# Patient Record
Sex: Male | Born: 1975 | Race: Black or African American | Hispanic: No | Marital: Single | Smoking: Never smoker
Health system: Southern US, Community
[De-identification: ages and names within clinical notes are randomized; demographics above are authoritative.]

## PROBLEM LIST (undated history)

## (undated) HISTORY — PX: CHOLECYSTECTOMY: SHX55

---

## 2015-07-23 ENCOUNTER — Emergency Department (HOSPITAL_COMMUNITY)
Admission: EM | Admit: 2015-07-23 | Discharge: 2015-07-23 | Disposition: A | Discharge status: Home / Self Care | Payer: Commercial Managed Care - HMO | Attending: Emergency Medicine | Admitting: Emergency Medicine

## 2015-07-23 ENCOUNTER — Emergency Department (HOSPITAL_COMMUNITY): Payer: Commercial Managed Care - HMO

## 2015-07-23 ENCOUNTER — Encounter (HOSPITAL_COMMUNITY): Payer: Self-pay | Admitting: Nurse Practitioner

## 2015-07-23 DIAGNOSIS — S0591XA Unspecified injury of right eye and orbit, initial encounter: Secondary | ICD-10-CM | POA: Diagnosis present

## 2015-07-23 DIAGNOSIS — S0231XA Fracture of orbital floor, right side, initial encounter for closed fracture: Secondary | ICD-10-CM | POA: Diagnosis not present

## 2015-07-23 DIAGNOSIS — Y9289 Other specified places as the place of occurrence of the external cause: Secondary | ICD-10-CM | POA: Diagnosis not present

## 2015-07-23 DIAGNOSIS — S0285XA Fracture of orbit, unspecified, initial encounter for closed fracture: Secondary | ICD-10-CM

## 2015-07-23 DIAGNOSIS — W228XXA Striking against or struck by other objects, initial encounter: Secondary | ICD-10-CM | POA: Insufficient documentation

## 2015-07-23 DIAGNOSIS — Y998 Other external cause status: Secondary | ICD-10-CM | POA: Diagnosis not present

## 2015-07-23 DIAGNOSIS — S0280XA Fracture of other specified skull and facial bones, unspecified side, initial encounter for closed fracture: Secondary | ICD-10-CM

## 2015-07-23 DIAGNOSIS — Y9389 Activity, other specified: Secondary | ICD-10-CM | POA: Diagnosis not present

## 2015-07-23 LAB — I-STAT CHEM 8, ED
BUN: 7 mg/dL (ref 6–20)
Calcium, Ion: 1.17 mmol/L (ref 1.12–1.23)
Chloride: 103 mmol/L (ref 101–111)
Creatinine, Ser: 1.1 mg/dL (ref 0.61–1.24)
Glucose, Bld: 82 mg/dL (ref 65–99)
HCT: 44 % (ref 39.0–52.0)
Hemoglobin: 15 g/dL (ref 13.0–17.0)
Potassium: 3.9 mmol/L (ref 3.5–5.1)
Sodium: 143 mmol/L (ref 135–145)
TCO2: 27 mmol/L (ref 0–100)

## 2015-07-23 MED ORDER — IOPAMIDOL (ISOVUE-300) INJECTION 61%
INTRAVENOUS | Status: AC
  Administered 2015-07-23: 75 mL
  Filled 2015-07-23: qty 75

## 2015-07-23 MED ORDER — TETRACAINE HCL 0.5 % OP SOLN
1.0000 [drp] | Freq: Once | OPHTHALMIC | Status: AC
  Administered 2015-07-23: 1 [drp] via OPHTHALMIC
  Filled 2015-07-23: qty 2

## 2015-07-23 MED ORDER — IBUPROFEN 600 MG PO TABS: 600.0000 mg | ORAL_TABLET | Freq: Four times a day (QID) | ORAL | Status: DC | PRN

## 2015-07-23 MED ORDER — FLUORESCEIN SODIUM 1 MG OP STRP
1.0000 | ORAL_STRIP | Freq: Once | OPHTHALMIC | Status: AC
  Administered 2015-07-23: 1 via OPHTHALMIC
  Filled 2015-07-23: qty 1
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
## 2015-07-23 NOTE — Discharge Instructions (Signed)
Orbital Floor Fracture, Non-Blowout The eye sits in the part of the skull called the "orbit." The upper and outside walls of the orbit are thick and strong. The inside wall (near the nose) and the orbital floor are very thin and weak. The tissues around the eye will briefly press together if there is a direct blow to the front of the eye. This leads to high pressure against the orbital walls. The inside wall and the orbital floor may break since these are the weakest walls. If the orbital floor breaks, the tissues around the eye, including the muscle that makes the eye look down, may become trapped in the sinus below when the orbital floor "blows out." If a blowout does not happen, the orbital floor fracture is considered a non-blowout orbital fracture. CAUSES An orbital floor fracture can be caused by any accident in which an object hits the face or the face strikes against a hard object. The most common ways that people break their eye socket include:  Being hit by a blunt object, such as a baseball bat or a fist.  Striking the face on the car dashboard during a crash.  Falls.  Gunshot. SYMPTOMS  If there has been no injury to the eye itself, symptoms may include:  Puffiness (swelling) and bruising around the eye area (black eye).  Numbness of the cheek and upper gum on the side with the floor fracture. This is caused by nerve injury to these areas.  Pain around the eye.  Headache.  Ear pain on the injured side. DIAGNOSIS The diagnosis of an orbital floor fracture is suspected during an eye exam by an ophthalmologist. It is confirmed by X-rays or CT scan. TREATMENT Your caregiver may suggest waiting 1 or 2 weeks for the swelling to go away before examining the eye. When the swelling lessens, your caregiver will examine the eye to see if there is any sign of a trapped muscle or double vision when looking in different directions. If double vision is not found and muscle or tissue did not  get trapped, no further treatment is necessary. After that, in almost all cases, the bones heal together on their own.  HOME CARE INSTRUCTIONS  Take all pain medicine as directed by your caregiver.  Use ice packs or other cold therapy to reduce swelling as directed by your caregiver.  Do not put a contact lens in the injured eye until your caregiver approves.  Avoid dusty environments.  Always wear protective glasses or goggles when recommended. Wearing protective eyewear is not dangerous to your injured eye and will not delay healing.  As long as your other eye is seeing normally, you may return to work and drive.  You may travel by plane or be in high altitudes. However, your swelling may take longer to go away, and you may have sinus pain.  Be aware that your depth perception and your ability to judge distance may be reduced or lost. SEEK IMMEDIATE MEDICAL CARE IF:  Your vision changes.  Your redness or swelling persists around the injured eye or gets worse.  You start to have double vision.  You have a bloody or discolored discharge from your nose.  You have a fever that lasts longer than 2 to 3 days.  You have a fever that suddenly gets worse.  Your cheek or upper gum numbness does not go away. MAKE SURE YOU:  Understand these instructions.  Will watch your condition.  Will get help right away if  you are not doing well or get worse.   This information is not intended to replace advice given to you by your health care provider. Make sure you discuss any questions you have with your health care provider.   Document Released: 04/30/2011 Document Revised: 02/26/2014 Document Reviewed: 04/30/2011 Elsevier Interactive Patient Education [employer]2016 [employer] Inc.

## 2015-07-23 NOTE — ED Notes (Signed)
EDP at bedside  
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
## 2015-12-08 ENCOUNTER — Encounter (HOSPITAL_BASED_OUTPATIENT_CLINIC_OR_DEPARTMENT_OTHER): Payer: Self-pay | Admitting: *Deleted

## 2015-12-08 ENCOUNTER — Emergency Department (HOSPITAL_BASED_OUTPATIENT_CLINIC_OR_DEPARTMENT_OTHER)
Admission: EM | Admit: 2015-12-08 | Discharge: 2015-12-08 | Disposition: A | Discharge status: Home / Self Care | Payer: Commercial Managed Care - HMO | Attending: Emergency Medicine | Admitting: Emergency Medicine

## 2015-12-08 DIAGNOSIS — Z792 Long term (current) use of antibiotics: Secondary | ICD-10-CM | POA: Insufficient documentation

## 2015-12-08 DIAGNOSIS — L03113 Cellulitis of right upper limb: Secondary | ICD-10-CM | POA: Diagnosis not present

## 2015-12-08 DIAGNOSIS — Y939 Activity, unspecified: Secondary | ICD-10-CM | POA: Diagnosis not present

## 2015-12-08 DIAGNOSIS — Y999 Unspecified external cause status: Secondary | ICD-10-CM | POA: Insufficient documentation

## 2015-12-08 DIAGNOSIS — W57XXXA Bitten or stung by nonvenomous insect and other nonvenomous arthropods, initial encounter: Secondary | ICD-10-CM | POA: Diagnosis not present

## 2015-12-08 DIAGNOSIS — S60861A Insect bite (nonvenomous) of right wrist, initial encounter: Secondary | ICD-10-CM | POA: Diagnosis present

## 2015-12-08 DIAGNOSIS — Y929 Unspecified place or not applicable: Secondary | ICD-10-CM | POA: Insufficient documentation

## 2015-12-08 LAB — COMPREHENSIVE METABOLIC PANEL
ALBUMIN: 3.3 g/dL — AB (ref 3.5–5.0)
ALK PHOS: 80 U/L (ref 38–126)
ALT: 16 U/L — AB (ref 17–63)
ANION GAP: 6 (ref 5–15)
AST: 21 U/L (ref 15–41)
BILIRUBIN TOTAL: 0.3 mg/dL (ref 0.3–1.2)
BUN: 10 mg/dL (ref 6–20)
CALCIUM: 8.8 mg/dL — AB (ref 8.9–10.3)
CO2: 27 mmol/L (ref 22–32)
CREATININE: 1.11 mg/dL (ref 0.61–1.24)
Chloride: 105 mmol/L (ref 101–111)
GFR calc Af Amer: >60 mL/min (ref >60)
GFR calc non Af Amer: >60 mL/min (ref >60)
GLUCOSE: 122 mg/dL — AB (ref 65–99)
Potassium: 3.8 mmol/L (ref 3.5–5.1)
SODIUM: 138 mmol/L (ref 135–145)
TOTAL PROTEIN: 7.5 g/dL (ref 6.5–8.1)

## 2015-12-08 LAB — CBC WITH DIFFERENTIAL/PLATELET
BASOS PCT: 0 %
Basophils Absolute: 0 10*3/uL (ref 0.0–0.1)
EOS ABS: 0.2 10*3/uL (ref 0.0–0.7)
Eosinophils Relative: 2 %
HEMATOCRIT: 41.3 % (ref 39.0–52.0)
HEMOGLOBIN: 14 g/dL (ref 13.0–17.0)
LYMPHS PCT: 18 %
Lymphs Abs: 2 10*3/uL (ref 0.7–4.0)
MCH: 28.6 pg (ref 26.0–34.0)
MCHC: 33.9 g/dL (ref 30.0–36.0)
MCV: 84.5 fL (ref 78.0–100.0)
Monocytes Absolute: 0.9 10*3/uL (ref 0.1–1.0)
Monocytes Relative: 8 %
NEUTROS PCT: 72 %
Neutro Abs: 8.2 10*3/uL — ABNORMAL HIGH (ref 1.7–7.7)
Platelets: 253 10*3/uL (ref 150–400)
RBC: 4.89 MIL/uL (ref 4.22–5.81)
RDW: 13.9 % (ref 11.5–15.5)
WBC: 11.3 10*3/uL — AB (ref 4.0–10.5)

## 2015-12-08 MED ORDER — SULFAMETHOXAZOLE-TRIMETHOPRIM 800-160 MG PO TABS: 1.0000 | ORAL_TABLET | Freq: Two times a day (BID) | ORAL | 0 refills | Status: AC

## 2015-12-08 MED ORDER — VANCOMYCIN HCL 10 G IV SOLR
15.0000 mg/kg | Freq: Once | INTRAVENOUS | Status: AC
  Administered 2015-12-08: 1844 mg via INTRAVENOUS
  Filled 2015-12-08: qty 1844

## 2015-12-08 MED ORDER — CEPHALEXIN 500 MG PO CAPS: 500.0000 mg | ORAL_CAPSULE | Freq: Four times a day (QID) | ORAL | 0 refills | Status: DC

## 2015-12-08 MED ORDER — VANCOMYCIN HCL 500 MG IV SOLR
INTRAVENOUS | Status: AC
  Filled 2015-12-08: qty 2000

## 2015-12-08 NOTE — ED Triage Notes (Signed)
Possible insect bite to his right wrist. [facility] and treated with antibiotics. Here today for a recheck.

## 2015-12-08 NOTE — Discharge Instructions (Signed)
STOP taking the clindamycin and start the new antibiotics. If the rash is spreading Friday or Saturday please return to the emergency department.  The rash may not be gone completely, but if the rash is not improved by Sunday please return to the emergency department

## 2015-12-08 NOTE — ED Provider Notes (Signed)
[facility] Provider Note   CSN: [id number] Arrival date & time: 12/08/15  1431     History   Chief Complaint Chief Complaint  Patient presents with  . Insect Bite    HPI Victor Crawford is a 40 y.o. male.   Rash   This is a new problem. The current episode started more than 2 days ago. The problem has not changed since onset.The problem is associated with nothing. There has been no fever. The rash is present on the right wrist. The pain is at a severity of 5/10. The pain is mild. The pain has been constant since onset. Associated symptoms include blisters, itching, pain and weeping. Treatments tried: clindamycin. The treatment provided no relief.    History reviewed. No pertinent past medical history.  There are no active problems to display for this patient.   Past Surgical History:  Procedure Laterality Date  . CHOLECYSTECTOMY         Home Medications    Prior to Admission medications   Medication Sig Start Date End Date Taking? Authorizing Provider  clindamycin (CLEOCIN) 300 MG capsule Take 300 mg by mouth 3 (three) times daily.   Yes Historical Provider, MD  cephALEXin (KEFLEX) 500 MG capsule Take 1 capsule (500 mg total) by mouth 4 (four) times daily. 12/08/15   Marily Memos, MD  ibuprofen (ADVIL,MOTRIN) 600 MG tablet Take 1 tablet (600 mg total) by mouth every 6 (six) hours as needed. 07/23/15   Lyndal Pulley, MD  sulfamethoxazole-trimethoprim (BACTRIM DS,SEPTRA DS) 800-160 MG tablet Take 1 tablet by mouth 2 (two) times daily. 12/08/15 12/15/15  Marily Memos, MD    Family History No family history on file.  Social History Social History  Substance Use Topics  . Smoking status: Never Smoker  . Smokeless tobacco: Never Used  . Alcohol use No     Allergies   Review of patient's allergies indicates no known allergies.   Review of Systems Review of Systems  Skin: Positive for itching and rash.  All other systems reviewed and are  negative.    Physical Exam Updated Vital Signs BP 126/88 (BP Location: Left Arm)   Pulse 75   Temp 99 F (37.2 C) (Oral)   Resp 18   Ht 5\' 5"  (1.651 m)   Wt 271 lb (122.9 kg)   SpO2 96%   BMI 45.10 kg/m   Physical Exam  Constitutional: He is oriented to person, place, and time. He appears well-developed and well-nourished.  HENT:  Head: Normocephalic and atraumatic.  Eyes: Conjunctivae are normal.  Neck: Normal range of motion. Neck supple.  Cardiovascular: Normal rate and regular rhythm.   No murmur heard. Pulmonary/Chest: Effort normal and breath sounds normal. No respiratory distress.  Abdominal: Soft. There is no tenderness. There is no guarding.  Musculoskeletal: He exhibits no edema or deformity.  No pain with ROM of right wrist  Neurological: He is alert and oriented to person, place, and time.  Skin: Skin is warm and dry. Rash (to lateral right wrist, not circumferential. mild serosanguos drainage. ) noted.  Psychiatric: He has a normal mood and affect.  Nursing note and vitals reviewed.    ED Treatments / Results  Labs (all labs ordered are listed, but only abnormal results are displayed) Labs Reviewed  CBC WITH DIFFERENTIAL/PLATELET - Abnormal; Notable for the following:       Result Value   WBC 11.3 (*)    Neutro Abs 8.2 (*)    All other components  within normal limits  COMPREHENSIVE METABOLIC PANEL - Abnormal; Notable for the following:    Glucose, Bld 122 (*)    Calcium 8.8 (*)    Albumin 3.3 (*)    ALT 16 (*)    All other components within normal limits    EKG  EKG Interpretation None       Radiology No results found.  Procedures Procedures (including critical care time)  Medications Ordered in ED Medications  vancomycin (VANCOCIN) 500 MG powder (  Not Given 12/08/15 1616)  vancomycin (VANCOCIN) 1,844 mg in sodium chloride 0.9 % 500 mL IVPB (0 mg/kg  122.9 kg Intravenous Stopped 12/08/15 1802)     Initial Impression / Assessment  and Plan / ED Course  I have reviewed the triage vital signs and the nursing notes.  Pertinent labs & imaging results that were available during my care of the patient were reviewed by me and considered in my medical decision making (see chart for details).  Clinical Course   No pain with ROM of joint. Erythema localized over lateral aspect, doubt septic arthritis at this time. Been on clindamycin for a couple days, not improving, not worsening. Plan for IV vancomycin, switch to keflex/bactrim.  reeval with mild improvement. No e/o sepsis, joint or systemic involvement.   Final Clinical Impressions(s) / ED Diagnoses   Final diagnoses:  Cellulitis of right upper extremity    New Prescriptions New Prescriptions   CEPHALEXIN (KEFLEX) 500 MG CAPSULE    Take 1 capsule (500 mg total) by mouth 4 (four) times daily.   SULFAMETHOXAZOLE-TRIMETHOPRIM (BACTRIM DS,SEPTRA DS) 800-160 MG TABLET    Take 1 tablet by mouth 2 (two) times daily.     Marily MemosJason Ulyses Panico, MD 12/08/15 1900
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
## 2018-06-23 DIAGNOSIS — Z6841 Body Mass Index (BMI) 40.0 and over, adult: Secondary | ICD-10-CM | POA: Diagnosis not present

## 2018-06-23 DIAGNOSIS — E291 Testicular hypofunction: Secondary | ICD-10-CM | POA: Diagnosis not present

## 2018-06-23 DIAGNOSIS — M25569 Pain in unspecified knee: Secondary | ICD-10-CM | POA: Diagnosis not present

## 2019-05-14 ENCOUNTER — Inpatient Hospital Stay (HOSPITAL_COMMUNITY)
Admission: AD | Admit: 2019-05-14 | Discharge: 2019-05-19 | DRG: 177 | Disposition: A | Discharge status: Home / Self Care | Payer: 59 | Source: Other Acute Inpatient Hospital | Attending: Internal Medicine | Admitting: Internal Medicine

## 2019-05-14 ENCOUNTER — Other Ambulatory Visit: Payer: Self-pay

## 2019-05-14 ENCOUNTER — Encounter (HOSPITAL_COMMUNITY): Payer: Self-pay | Admitting: Internal Medicine

## 2019-05-14 DIAGNOSIS — G4733 Obstructive sleep apnea (adult) (pediatric): Secondary | ICD-10-CM | POA: Diagnosis present

## 2019-05-14 DIAGNOSIS — J1282 Pneumonia due to coronavirus disease 2019: Secondary | ICD-10-CM | POA: Diagnosis not present

## 2019-05-14 DIAGNOSIS — R748 Abnormal levels of other serum enzymes: Secondary | ICD-10-CM | POA: Diagnosis not present

## 2019-05-14 DIAGNOSIS — Z6841 Body Mass Index (BMI) 40.0 and over, adult: Secondary | ICD-10-CM

## 2019-05-14 DIAGNOSIS — E1165 Type 2 diabetes mellitus with hyperglycemia: Secondary | ICD-10-CM | POA: Diagnosis present

## 2019-05-14 DIAGNOSIS — T380X5A Adverse effect of glucocorticoids and synthetic analogues, initial encounter: Secondary | ICD-10-CM | POA: Diagnosis present

## 2019-05-14 DIAGNOSIS — J9601 Acute respiratory failure with hypoxia: Secondary | ICD-10-CM | POA: Diagnosis not present

## 2019-05-14 DIAGNOSIS — U071 COVID-19: Principal | ICD-10-CM

## 2019-05-14 DIAGNOSIS — M7989 Other specified soft tissue disorders: Secondary | ICD-10-CM | POA: Diagnosis not present

## 2019-05-14 DIAGNOSIS — Z79899 Other long term (current) drug therapy: Secondary | ICD-10-CM | POA: Diagnosis not present

## 2019-05-14 DIAGNOSIS — R739 Hyperglycemia, unspecified: Secondary | ICD-10-CM

## 2019-05-14 DIAGNOSIS — I10 Essential (primary) hypertension: Secondary | ICD-10-CM | POA: Diagnosis present

## 2019-05-14 DIAGNOSIS — R0602 Shortness of breath: Secondary | ICD-10-CM

## 2019-05-14 HISTORY — DX: Morbid (severe) obesity due to excess calories: E66.01

## 2019-05-14 LAB — COMPREHENSIVE METABOLIC PANEL
ALT: 186 U/L — ABNORMAL HIGH (ref 0–44)
AST: 169 U/L — ABNORMAL HIGH (ref 15–41)
Albumin: 2.5 g/dL — ABNORMAL LOW (ref 3.5–5.0)
Alkaline Phosphatase: 89 U/L (ref 38–126)
Anion gap: 10 (ref 5–15)
BUN: 13 mg/dL (ref 6–20)
CO2: 24 mmol/L (ref 22–32)
Calcium: 8.3 mg/dL — ABNORMAL LOW (ref 8.9–10.3)
Chloride: 104 mmol/L (ref 98–111)
Creatinine, Ser: 0.98 mg/dL (ref 0.61–1.24)
GFR calc Af Amer: >60 mL/min (ref >60)
GFR calc non Af Amer: >60 mL/min (ref >60)
Glucose, Bld: 321 mg/dL — ABNORMAL HIGH (ref 70–99)
Potassium: 4.3 mmol/L (ref 3.5–5.1)
Sodium: 138 mmol/L (ref 135–145)
Total Bilirubin: 0.6 mg/dL (ref 0.3–1.2)
Total Protein: 7.5 g/dL (ref 6.5–8.1)

## 2019-05-14 LAB — BLOOD GAS, ARTERIAL
Acid-base deficit: 0.1 mmol/L (ref 0.0–2.0)
Bicarbonate: 23.8 mmol/L (ref 20.0–28.0)
Drawn by: 406621
FIO2: 100
O2 Saturation: 92.7 %
Patient temperature: 36.7
pCO2 arterial: 36.8 mmHg (ref 32.0–48.0)
pH, Arterial: 7.424 (ref 7.350–7.450)
pO2, Arterial: 67.8 mmHg — ABNORMAL LOW (ref 83.0–108.0)

## 2019-05-14 LAB — ABO/RH: ABO/RH(D): B POS

## 2019-05-14 LAB — CBC WITH DIFFERENTIAL/PLATELET
Abs Immature Granulocytes: 0.1 10*3/uL — ABNORMAL HIGH (ref 0.00–0.07)
Basophils Absolute: 0 10*3/uL (ref 0.0–0.1)
Basophils Relative: 0 %
Blasts: 2 %
Eosinophils Absolute: 0 10*3/uL (ref 0.0–0.5)
Eosinophils Relative: 0 %
HCT: 44.4 % (ref 39.0–52.0)
Hemoglobin: 14.5 g/dL (ref 13.0–17.0)
Lymphocytes Relative: 10 %
Lymphs Abs: 0.6 10*3/uL — ABNORMAL LOW (ref 0.7–4.0)
MCH: 27.2 pg (ref 26.0–34.0)
MCHC: 32.7 g/dL (ref 30.0–36.0)
MCV: 83.1 fL (ref 80.0–100.0)
Monocytes Absolute: 0.1 10*3/uL (ref 0.1–1.0)
Monocytes Relative: 1 %
Myelocytes: 1 %
Neutro Abs: 4.7 10*3/uL (ref 1.7–7.7)
Neutrophils Relative %: 86 %
Platelets: 178 10*3/uL (ref 150–400)
RBC: 5.34 MIL/uL (ref 4.22–5.81)
RDW: 15.8 % — ABNORMAL HIGH (ref 11.5–15.5)
WBC: 5.5 10*3/uL (ref 4.0–10.5)
nRBC: 0 /100 WBC
nRBC: 0.4 % — ABNORMAL HIGH (ref 0.0–0.2)

## 2019-05-14 LAB — C-REACTIVE PROTEIN: CRP: 11.6 mg/dL — ABNORMAL HIGH (ref <1.0)

## 2019-05-14 LAB — GLUCOSE, CAPILLARY
Glucose-Capillary: 276 mg/dL — ABNORMAL HIGH (ref 70–99)
Glucose-Capillary: 282 mg/dL — ABNORMAL HIGH (ref 70–99)
Glucose-Capillary: 317 mg/dL — ABNORMAL HIGH (ref 70–99)

## 2019-05-14 LAB — HEMOGLOBIN A1C
Hgb A1c MFr Bld: 9.3 % — ABNORMAL HIGH (ref 4.8–5.6)
Mean Plasma Glucose: 220.21 mg/dL

## 2019-05-14 LAB — HIV ANTIBODY (ROUTINE TESTING W REFLEX): HIV Screen 4th Generation wRfx: NONREACTIVE

## 2019-05-14 LAB — MRSA PCR SCREENING: MRSA by PCR: POSITIVE — AB

## 2019-05-14 MED ORDER — ALBUTEROL SULFATE HFA 108 (90 BASE) MCG/ACT IN AERS
2.0000 | INHALATION_SPRAY | Freq: Four times a day (QID) | RESPIRATORY_TRACT | Status: DC
  Administered 2019-05-14 – 2019-05-16 (×8): 2 via RESPIRATORY_TRACT
  Filled 2019-05-14: qty 6.7

## 2019-05-14 MED ORDER — SODIUM CHLORIDE 0.9 % IV SOLN: 100.0000 mg | Freq: Every day | INTRAVENOUS | Status: DC

## 2019-05-14 MED ORDER — SODIUM CHLORIDE 0.9 % IV SOLN
200.0000 mg | Freq: Once | INTRAVENOUS | Status: DC
  Filled 2019-05-14: qty 40

## 2019-05-14 MED ORDER — ASCORBIC ACID 500 MG PO TABS
500.0000 mg | ORAL_TABLET | Freq: Every day | ORAL | Status: DC
  Administered 2019-05-14 – 2019-05-19 (×6): 500 mg via ORAL
  Filled 2019-05-14 (×6): qty 1

## 2019-05-14 MED ORDER — ACETAMINOPHEN 325 MG PO TABS
650.0000 mg | ORAL_TABLET | Freq: Four times a day (QID) | ORAL | Status: DC | PRN
  Administered 2019-05-14: 650 mg via ORAL
  Filled 2019-05-14: qty 2

## 2019-05-14 MED ORDER — DEXAMETHASONE SODIUM PHOSPHATE 10 MG/ML IJ SOLN
6.0000 mg | INTRAMUSCULAR | Status: DC
  Administered 2019-05-14 – 2019-05-18 (×5): 6 mg via INTRAVENOUS
  Filled 2019-05-14 (×5): qty 1

## 2019-05-14 MED ORDER — SODIUM CHLORIDE 0.9 % IV SOLN
2.0000 g | INTRAVENOUS | Status: DC
  Administered 2019-05-14: 2 g via INTRAVENOUS
  Filled 2019-05-14: qty 20

## 2019-05-14 MED ORDER — INSULIN ASPART 100 UNIT/ML ~~LOC~~ SOLN
0.0000 [IU] | Freq: Three times a day (TID) | SUBCUTANEOUS | Status: DC
  Administered 2019-05-14 – 2019-05-15 (×2): 8 [IU] via SUBCUTANEOUS

## 2019-05-14 MED ORDER — INSULIN DETEMIR 100 UNIT/ML ~~LOC~~ SOLN
14.0000 [IU] | Freq: Two times a day (BID) | SUBCUTANEOUS | Status: DC
  Administered 2019-05-14 – 2019-05-15 (×2): 14 [IU] via SUBCUTANEOUS
  Filled 2019-05-14 (×3): qty 0.14

## 2019-05-14 MED ORDER — INSULIN ASPART 100 UNIT/ML ~~LOC~~ SOLN
0.0000 [IU] | Freq: Every day | SUBCUTANEOUS | Status: DC
  Administered 2019-05-14: 4 [IU] via SUBCUTANEOUS
  Administered 2019-05-15 – 2019-05-16 (×2): 5 [IU] via SUBCUTANEOUS
  Administered 2019-05-17: 4 [IU] via SUBCUTANEOUS
  Administered 2019-05-18: 5 [IU] via SUBCUTANEOUS

## 2019-05-14 MED ORDER — TOCILIZUMAB 400 MG/20ML IV SOLN
800.0000 mg | Freq: Once | INTRAVENOUS | Status: AC
  Administered 2019-05-14: 800 mg via INTRAVENOUS
  Filled 2019-05-14: qty 40

## 2019-05-14 MED ORDER — AZITHROMYCIN 500 MG PO TABS
500.0000 mg | ORAL_TABLET | Freq: Every day | ORAL | Status: DC
  Administered 2019-05-14 – 2019-05-15 (×2): 500 mg via ORAL
  Filled 2019-05-14 (×2): qty 1

## 2019-05-14 MED ORDER — INSULIN ASPART 100 UNIT/ML ~~LOC~~ SOLN
0.0000 [IU] | Freq: Three times a day (TID) | SUBCUTANEOUS | Status: DC
  Administered 2019-05-14: 5 [IU] via SUBCUTANEOUS

## 2019-05-14 MED ORDER — ZINC SULFATE 220 (50 ZN) MG PO CAPS
220.0000 mg | ORAL_CAPSULE | Freq: Every day | ORAL | Status: DC
  Administered 2019-05-14 – 2019-05-19 (×6): 220 mg via ORAL
  Filled 2019-05-14 (×5): qty 1

## 2019-05-14 MED ORDER — FAMOTIDINE 20 MG PO TABS
20.0000 mg | ORAL_TABLET | Freq: Two times a day (BID) | ORAL | Status: DC
  Administered 2019-05-14 – 2019-05-19 (×10): 20 mg via ORAL
  Filled 2019-05-14 (×11): qty 1

## 2019-05-14 MED ORDER — ONDANSETRON HCL 4 MG/2ML IJ SOLN: 4.0000 mg | Freq: Four times a day (QID) | INTRAMUSCULAR | Status: DC | PRN

## 2019-05-14 MED ORDER — GUAIFENESIN-DM 100-10 MG/5ML PO SYRP: 10.0000 mL | ORAL_SOLUTION | ORAL | Status: DC | PRN

## 2019-05-14 MED ORDER — ONDANSETRON HCL 4 MG PO TABS: 4.0000 mg | ORAL_TABLET | Freq: Four times a day (QID) | ORAL | Status: DC | PRN

## 2019-05-14 MED ORDER — HYDROCOD POLST-CPM POLST ER 10-8 MG/5ML PO SUER
5.0000 mL | Freq: Two times a day (BID) | ORAL | Status: DC | PRN
  Administered 2019-05-14 – 2019-05-18 (×3): 5 mL via ORAL
  Filled 2019-05-14 (×3): qty 5

## 2019-05-14 MED ORDER — ENOXAPARIN SODIUM 80 MG/0.8ML ~~LOC~~ SOLN
0.5000 mg/kg | SUBCUTANEOUS | Status: DC
  Filled 2019-05-14: qty 0.8

## 2019-05-14 MED ORDER — LIVING WELL WITH DIABETES BOOK
Freq: Once | Status: AC
  Filled 2019-05-14: qty 1

## 2019-05-14 MED ORDER — SODIUM CHLORIDE 0.9 % IV SOLN
100.0000 mg | Freq: Every day | INTRAVENOUS | Status: AC
  Administered 2019-05-14 – 2019-05-17 (×4): 100 mg via INTRAVENOUS
  Filled 2019-05-14 (×4): qty 20

## 2019-05-14 MED ORDER — ENOXAPARIN SODIUM 80 MG/0.8ML ~~LOC~~ SOLN
0.5000 mg/kg | SUBCUTANEOUS | Status: DC
  Administered 2019-05-14: 70 mg via SUBCUTANEOUS
  Filled 2019-05-14 (×2): qty 0.8

## 2019-05-14 NOTE — Progress Notes (Addendum)
MEDICATION RELATED CONSULT NOTE - INITIAL   Pharmacy Consult for Remdesivir Indication: Covid -19 positive  No Known Allergies  Patient Measurements: Height: 5\' 5"  (165.1 cm) Weight: (!) 311 lb (141.1 kg) IBW/kg (Calculated) : 61.5  Vital Signs: Temp: 98.2 F (36.8 C) (03/25 1009) Temp Source: Axillary (03/25 1009) BP: 147/93 (03/25 1009) Pulse Rate: 85 (03/25 1009) Intake/Output from previous day: No intake/output data recorded. Intake/Output from this shift: No intake/output data recorded.  Labs: Recent Labs    05/14/19 1101  WBC 5.5  HGB 14.5  HCT 44.4  PLT 178  CREATININE 0.98  ALBUMIN 2.5*  PROT 7.5  AST 169*  ALT 186*  ALKPHOS 89  BILITOT 0.6   Estimated Creatinine Clearance: 124.3 mL/min (by C-G formula based on SCr of 0.98 mg/dL).   Microbiology: No results found for this or any previous visit (from the past 720 hour(s)).  Medical History: Past Medical History:  Diagnosis Date  . Morbid obesity (HCC)     Medications:  Scheduled:  . albuterol  2 puff Inhalation Q6H  . vitamin C  500 mg Oral Daily  . azithromycin  500 mg Oral Daily  . dexamethasone (DECADRON) injection  6 mg Intravenous Q24H  . enoxaparin (LOVENOX) injection  0.5 mg/kg Subcutaneous Q24H  . famotidine  20 mg Oral BID  . insulin aspart  0-9 Units Subcutaneous TID WC  . zinc sulfate  220 mg Oral Daily    Assessment: 44 y.[facility] male [facility] -- presented to [facility] on 3/24 with complaints of worsening shortness of breath.  He had been diagnosed at [facility] on 3/20 with COVID-19.    Patient reportedly was given 1 dose of steroids and remdesivir prior to transport.  I confirmed with [facility] pharmacist that patient received 1st dose of Remdesivir 200 mg o 05/12/21 @ 22:39 and dexamethasone 6 mg IV on 3/24 @ 22:38.   Also confirmed patient received lovenox 0.5 mg/kg = 70 mg at 01:00 on 05/14/19.   AST 329, ALT 237 @ [facility] 05/13/19> down to 169/186 today.      Plan:  Remdesivir 100 mg IV q24h x 4 days 3/25>05/17/19 Total days of Remdesivir 3/24 >>05/17/19.  05/19/19, RPh Clinical Pharmacist Please check AMION for all [facility] phone numbers After 10:00 PM, call Main Pharmacy [phone] 05/14/2019,12:19 PM

## 2019-05-14 NOTE — H&P (Signed)
History and Physical    Victor Crawford FMB:[id number] DOB: 1975/09/19 DOA: 05/14/2019  Referring MD/NP/PA: Odie Sera, MD PCP: Patient, No Pcp Per  Patient coming from: Transfer from [facility]    Chief Complaint: Shortness of breath  I have personally briefly reviewed patient's old medical records in [facility]   HPI: Victor Crawford is a 44 y.o. male with medical history significant of morbid obesity presented to [facility] health on 3/24 with complaints of worsening shortness of breath.  He had been diagnosed at [facility] on 3/20 with COVID-19.  Since his positive diagnosis he reported having a productive cough.  At home he had been given an inhaler to use, but reports no significant improvement in symptoms.  Associated symptoms included fever and general malaise.  Patient denies any history of tobacco or significant alcohol use.  Upon arrival to their emergency department patient was noted to be afebrile, but tachypneic with O2 saturations 68% on room air.  He was placed on a nonrebreather with improvement to greater than 90%.  Chest x-ray revealed mild hazy diffuse bilateral opacities.  Labs revealed CBC within normal limits, creatinine 0.9, glucose 239, AST 329, ALT 237, alkaline phosphatase 127 D-dimer 3757, procalcitonin 0.25, CRP 82.2, LDH 3230, and procalcitonin 0.25.  COVID-19 screen was positive.  Due to the patient's elevated D-dimer CT angiogram was performed.  Did not reveal signs of a PE, but did note extensive bilateral opacities consistent with Covid pneumonia.  Blood cultures have been obtained.  Patient reportedly was given 1 dose of steroids and remdesivir prior to transport. ED Course: As seen above  Review of Systems  Constitutional: Positive for fever and malaise/fatigue.  HENT: Negative for congestion and ear discharge.   Eyes: Negative for photophobia and pain.  Respiratory: Positive for cough, sputum production and shortness of breath.   Cardiovascular: Negative for chest  pain and leg swelling.  Gastrointestinal: Negative for abdominal pain, nausea and vomiting.  Genitourinary: Negative for dysuria and hematuria.  Musculoskeletal: Negative for falls and joint pain.  Skin: Negative for rash.  Neurological: Negative for focal weakness and weakness.  Endo/Heme/Allergies: Negative for polydipsia. Does not bruise/bleed easily.  Psychiatric/Behavioral: Negative for substance abuse.    Past Medical History:  Diagnosis Date  . Morbid obesity (HCC)     Past Surgical History:  Procedure Laterality Date  . CHOLECYSTECTOMY       reports that he has never smoked. He has never used smokeless tobacco. He reports that he does not drink alcohol or use drugs.  No Known Allergies  No family history on file.  Prior to Admission medications   Medication Sig Start Date End Date Taking? Authorizing Provider  cephALEXin (KEFLEX) 500 MG capsule Take 1 capsule (500 mg total) by mouth 4 (four) times daily. 12/08/15   Mesner, Barbara Cower, MD  clindamycin (CLEOCIN) 300 MG capsule Take 300 mg by mouth 3 (three) times daily.    [provider]  ibuprofen (ADVIL,MOTRIN) 600 MG tablet Take 1 tablet (600 mg total) by mouth every 6 (six) hours as needed. 07/23/15   Lyndal Pulley, MD    Physical Exam:  Constitutional: Morbidly obese male who appears to be in some mild respiratory discomfort Vitals:   05/14/19 1009  BP: (!) 147/93  Pulse: 85  Resp: (!) 40  Temp: 98.2 F (36.8 C)  TempSrc: Axillary  SpO2: (!) 86%  Weight: (!) 141.1 kg  Height: 5\' 5"  (1.651 m)   Eyes: PERRL, lids and conjunctivae normal ENMT: Mucous membranes  are moist. Posterior pharynx clear of any exudate or lesions.   Neck: normal, supple, no masses, no thyromegaly Respiratory: Tachypneic with decreased overall aeration.  No significant wheezes or rhonchi appreciated.  Patient on high flow nasal cannula oxygen at 15 L as well as nonrebreather mask with O2 saturations 88-92%. Cardiovascular: Regular  rate and rhythm, no murmurs / rubs / gallops. No extremity edema. 2+ pedal pulses. No carotid bruits.  Abdomen: no tenderness, no masses palpated. No hepatosplenomegaly. Bowel sounds positive.  Musculoskeletal: no clubbing / cyanosis. No joint deformity upper and lower extremities. Good ROM, no contractures. Normal muscle tone.  Skin: no rashes, lesions, ulcers. No induration Neurologic: CN 2-12 grossly intact. Sensation intact, DTR normal. Strength 5/5 in all 4.  Psychiatric: Normal judgment and insight. Alert and oriented x 3. Normal mood.     Labs on Admission: I have personally reviewed following labs and imaging studies  CBC: No results for input(s): WBC, NEUTROABS, HGB, HCT, MCV, PLT in the last 168 hours. Basic Metabolic Panel: No results for input(s): NA, K, CL, CO2, GLUCOSE, BUN, CREATININE, CALCIUM, MG, PHOS in the last 168 hours. GFR: CrCl cannot be calculated (Patient's most recent lab result is older than the maximum 21 days allowed.). Liver Function Tests: No results for input(s): AST, ALT, ALKPHOS, BILITOT, PROT, ALBUMIN in the last 168 hours. No results for input(s): LIPASE, AMYLASE in the last 168 hours. No results for input(s): AMMONIA in the last 168 hours. Coagulation Profile: No results for input(s): INR, PROTIME in the last 168 hours. Cardiac Enzymes: No results for input(s): CKTOTAL, CKMB, CKMBINDEX, TROPONINI in the last 168 hours. BNP (last 3 results) No results for input(s): PROBNP in the last 8760 hours. HbA1C: No results for input(s): HGBA1C in the last 72 hours. CBG: No results for input(s): GLUCAP in the last 168 hours. Lipid Profile: No results for input(s): CHOL, HDL, LDLCALC, TRIG, CHOLHDL, LDLDIRECT in the last 72 hours. Thyroid Function Tests: No results for input(s): TSH, T4TOTAL, FREET4, T3FREE, THYROIDAB in the last 72 hours. Anemia Panel: No results for input(s): VITAMINB12, FOLATE, FERRITIN, TIBC, IRON, RETICCTPCT in the last 72  hours. Urine analysis: No results found for: COLORURINE, APPEARANCEUR, LABSPEC, PHURINE, GLUCOSEU, HGBUR, BILIRUBINUR, KETONESUR, PROTEINUR, UROBILINOGEN, NITRITE, LEUKOCYTESUR Sepsis Labs: No results found for this or any previous visit (from the past 240 hour(s)).   Radiological Exams on Admission: No results found.  EKG: Independently reviewed from outside facility.  Sinus tachycardia at 115 bpm with QTc 437.  Assessment/Plan Acute respiratory failure with hypoxia pneumonia due to COVID-19: Acute.  Patient presented with complaints of progressively worsening shortness of breath and cough since diagnosis on 3/20.  O2 saturations noted to be as low as 68% on room air requiring nonrebreather.  Inflammatory markers obtained at outside facility including D-dimer, procalcitonin, LDH, CRP, and ferritin.CT angiogram of the chest significant for diffuse bilateral opacities without signs of pulmonary embolus.  Upon arrival to our facility patient O2 saturations into the mid 80s and he was placed on a high flow nasal cannula oxygen and distant to the nonrebreather mask at 15 L. -Admit to a progressive bed -COVID-19 order set utilized -Continuous pulse oximetry with nasal cannula oxygen to maintain O2 saturation greater than 90% -Follow-up blood cultures from outside facility -Albuterol inhaler -Decadron IV and switch to p.o. when medically appropriate for 9 days -Continue remdesivir per pharmacy for 4 more days  -Add on empiric antibiotics of Rocephin and azithromycin due to elevated procalcitonin -Antitussives as needed -Vitamin  C and zinc -Continue to monitor inflammatory markers daily  Hyperglycemia: Acute.  Initial blood glucose elevated up to 239 at the outside facility yesterday.  Patient with no prior history of diabetes.  This could be a acute stress response. -Hypoglycemic protocols -Check hemoglobin A1c -CBGs before every meal with sensitive SSI  Elevated liver enzymes: Acute.  AST  329, ALT 237, alkaline phosphatase 127.  Elevated enzymes may be related with patient's history of morbid obesity. -Continue to monitor  Morbid obesity: BMI 51.75 kg/m  DVT prophylaxis: Lovenox Code Status: Full Family Communication: Attempted update significant other over the phone and left voicemail Disposition Plan: Possible discharge home when able to successfully wean oxygen requirements Consults called: None Admission status: Inpatient  Clydie Braun MD [facility] Pager [phone]   If 7PM-7AM, please contact night-coverage [email or web address] Password [email or web address]  05/14/2019, 10:08 AM

## 2019-05-14 NOTE — Significant Event (Signed)
Not a Rapid Response Event- Nurse Consult  Overview: Hypoxia d/t Covid PNA  Initial Focused Assessment: Our team was notified of recent events regarding Victor Crawford and his high O2 requirements. Victor Crawford is admitted with Covid PNA and currently being treated with Vit C, zithromax, rocephin, decadron, Remdesivir and Actemra. Upon arrival, Victor Crawford was lying prone on his left side. He is in no distress. He is alert, oriented x4 and denies SOB and has no increased WOB. He can speak easily in complete sentences. His skin is warm, pink and dry. He is a morbidly obese man able to self prone. BBS Clear and bilaterally diminished.   Temp 99.43F, HR 91 NSR, 138/94 (108), RR 22, Sats 94% on NRB 15L and 40L [facility]NC.   Victor Crawford does not require escalation of care at this present time.    Interventions: -Educated patient regarding rationale and benefits of self proning  Plan of Care: - Follow COVID Patient guidelines regarding acceptable O2 sats >85%, and 75%-80% upon exertion. Pt will require more time to recover following activity -Continue self proning -If pt has AMS, or increased WOB or sustained desaturations requiring increase in O2, notify primary svc for possible escalation in care to ICU.  Event Summary: Call received [phone] Arrived at call 1930 Call ended [phone]  Rose Fillers

## 2019-05-14 NOTE — Progress Notes (Signed)
Inpatient Diabetes Program Recommendations  AACE/ADA: New Consensus Statement on Inpatient Glycemic Control (2015)  Target Ranges:  Prepandial:   less than 140 mg/dL      Peak postprandial:   less than 180 mg/dL (1-2 hours)      Critically ill patients:  140 - 180 mg/dL   Lab Results  Component Value Date   GLUCAP 276 (H) 05/14/2019   HGBA1C 9.3 (H) 05/14/2019    Review of Glycemic Control Results for ORD, Victor Crawford (MRN [id number]) as of 05/14/2019 15:15  Ref. Range 05/14/2019 12:17  Glucose-Capillary Latest Ref Range: 70 - 99 mg/dL 688 (H)   Diabetes history: No prior hx Outpatient Diabetes medications: None Current orders for Inpatient glycemic control: Novolog sensitive tid + Decadron 6 mg qd  Inpatient Diabetes Program Recommendations:   While in the hospital on steroids: -Levemir 14 units bid (0.2 units/kg x 141.1 kg) -Increase Novolog correction to moderate tid + hs 0-5 units -Tradjenta 5 mg qd  Ordered Living Well With Diabetes Book. Nurses,please teach patient diabetes basics as appropriate. Will follow.  Thank you, [name]. Shaterra Sanzone, RN, MSN, CDE  Diabetes Coordinator Inpatient Glycemic Control Team Team Pager [phone] (8am-5pm) 05/14/2019 3:21 PM

## 2019-05-14 NOTE — Progress Notes (Signed)
Patient's O2 saturations appear to fluctuate into the mid 80s at times.  He was placed on heated high flow.  Denies any history of hepatitis or any other medical problems.  Risks and benefits of Actemra were discussed with the patient and he agreed to be given this medicine.  Hemoglobin A1c came back elevated at 9.6.  Adjusted insulin regimen to include Levemir 14 units twice daily, and moderate sliding scale insulin per diabetic education.

## 2019-05-14 NOTE — Progress Notes (Signed)
Notified significant other and father of progress.  All questions were answered and this nurse's contact number shared for further communication.

## 2019-05-14 NOTE — Progress Notes (Signed)
Lab notified Victor Crawford) ABG being sent by tube station to lab.

## 2019-05-15 ENCOUNTER — Inpatient Hospital Stay (HOSPITAL_COMMUNITY): Payer: 59

## 2019-05-15 DIAGNOSIS — M7989 Other specified soft tissue disorders: Secondary | ICD-10-CM

## 2019-05-15 LAB — GLUCOSE, CAPILLARY
Glucose-Capillary: 273 mg/dL — ABNORMAL HIGH (ref 70–99)
Glucose-Capillary: 264 mg/dL — ABNORMAL HIGH (ref 70–99)
Glucose-Capillary: 368 mg/dL — ABNORMAL HIGH (ref 70–99)
Glucose-Capillary: 325 mg/dL — ABNORMAL HIGH (ref 70–99)

## 2019-05-15 LAB — CBC WITH DIFFERENTIAL/PLATELET
Abs Immature Granulocytes: 0.13 10*3/uL — ABNORMAL HIGH (ref 0.00–0.07)
Basophils Absolute: 0 10*3/uL (ref 0.0–0.1)
Basophils Relative: 0 %
Eosinophils Absolute: 0 10*3/uL (ref 0.0–0.5)
Eosinophils Relative: 0 %
HCT: 45.2 % (ref 39.0–52.0)
Hemoglobin: 14.8 g/dL (ref 13.0–17.0)
Immature Granulocytes: 1 %
Lymphocytes Relative: 11 %
Lymphs Abs: 1.3 10*3/uL (ref 0.7–4.0)
MCH: 27.7 pg (ref 26.0–34.0)
MCHC: 32.7 g/dL (ref 30.0–36.0)
MCV: 84.6 fL (ref 80.0–100.0)
Monocytes Absolute: 0.7 10*3/uL (ref 0.1–1.0)
Monocytes Relative: 6 %
Neutro Abs: 9.6 10*3/uL — ABNORMAL HIGH (ref 1.7–7.7)
Neutrophils Relative %: 82 %
Platelets: 210 10*3/uL (ref 150–400)
RBC: 5.34 MIL/uL (ref 4.22–5.81)
RDW: 15.9 % — ABNORMAL HIGH (ref 11.5–15.5)
WBC: 11.7 10*3/uL — ABNORMAL HIGH (ref 4.0–10.5)
nRBC: 0 % (ref 0.0–0.2)

## 2019-05-15 LAB — COMPREHENSIVE METABOLIC PANEL
ALT: 144 U/L — ABNORMAL HIGH (ref 0–44)
AST: 79 U/L — ABNORMAL HIGH (ref 15–41)
Albumin: 2.6 g/dL — ABNORMAL LOW (ref 3.5–5.0)
Alkaline Phosphatase: 93 U/L (ref 38–126)
Anion gap: 12 (ref 5–15)
BUN: 20 mg/dL (ref 6–20)
CO2: 25 mmol/L (ref 22–32)
Calcium: 8.6 mg/dL — ABNORMAL LOW (ref 8.9–10.3)
Chloride: 102 mmol/L (ref 98–111)
Creatinine, Ser: 0.97 mg/dL (ref 0.61–1.24)
GFR calc Af Amer: >60 mL/min (ref >60)
GFR calc non Af Amer: >60 mL/min (ref >60)
Glucose, Bld: 323 mg/dL — ABNORMAL HIGH (ref 70–99)
Potassium: 4.5 mmol/L (ref 3.5–5.1)
Sodium: 139 mmol/L (ref 135–145)
Total Bilirubin: 0.6 mg/dL (ref 0.3–1.2)
Total Protein: 7 g/dL (ref 6.5–8.1)

## 2019-05-15 LAB — PROCALCITONIN: Procalcitonin: 0.14 ng/mL

## 2019-05-15 LAB — BRAIN NATRIURETIC PEPTIDE: B Natriuretic Peptide: 50 pg/mL (ref 0.0–100.0)

## 2019-05-15 LAB — C-REACTIVE PROTEIN: CRP: 6.3 mg/dL — ABNORMAL HIGH (ref <1.0)

## 2019-05-15 LAB — D-DIMER, QUANTITATIVE: D-Dimer, Quant: 15.62 ug/mL-FEU — ABNORMAL HIGH (ref 0.00–0.50)

## 2019-05-15 LAB — FERRITIN: Ferritin: 1175 ng/mL — ABNORMAL HIGH (ref 24–336)

## 2019-05-15 MED ORDER — INSULIN STARTER KIT- PEN NEEDLES (ENGLISH)
1.0000 | Freq: Once | Status: AC
  Administered 2019-05-15: 1
  Filled 2019-05-15: qty 1

## 2019-05-15 MED ORDER — CHLORHEXIDINE GLUCONATE CLOTH 2 % EX PADS
6.0000 | MEDICATED_PAD | Freq: Every day | CUTANEOUS | Status: AC
  Administered 2019-05-15 – 2019-05-19 (×5): 6 via TOPICAL

## 2019-05-15 MED ORDER — MUPIROCIN 2 % EX OINT
1.0000 "application " | TOPICAL_OINTMENT | Freq: Two times a day (BID) | CUTANEOUS | Status: DC
  Administered 2019-05-15 – 2019-05-19 (×8): 1 via NASAL
  Filled 2019-05-15: qty 22

## 2019-05-15 MED ORDER — ENOXAPARIN SODIUM 80 MG/0.8ML ~~LOC~~ SOLN
0.5000 mg/kg | Freq: Two times a day (BID) | SUBCUTANEOUS | Status: DC
  Administered 2019-05-15 – 2019-05-16 (×4): 70 mg via SUBCUTANEOUS
  Filled 2019-05-15 (×4): qty 0.8

## 2019-05-15 MED ORDER — INSULIN ASPART 100 UNIT/ML ~~LOC~~ SOLN
4.0000 [IU] | Freq: Three times a day (TID) | SUBCUTANEOUS | Status: DC
  Administered 2019-05-15 – 2019-05-19 (×12): 4 [IU] via SUBCUTANEOUS

## 2019-05-15 MED ORDER — INSULIN DETEMIR 100 UNIT/ML ~~LOC~~ SOLN
20.0000 [IU] | Freq: Two times a day (BID) | SUBCUTANEOUS | Status: DC
  Administered 2019-05-15 – 2019-05-19 (×8): 20 [IU] via SUBCUTANEOUS
  Filled 2019-05-15 (×9): qty 0.2

## 2019-05-15 MED ORDER — PRO-STAT SUGAR FREE PO LIQD
30.0000 mL | Freq: Two times a day (BID) | ORAL | Status: DC
  Administered 2019-05-15 – 2019-05-19 (×6): 30 mL via ORAL
  Filled 2019-05-15 (×8): qty 30

## 2019-05-15 MED ORDER — GLUCERNA SHAKE PO LIQD
237.0000 mL | Freq: Three times a day (TID) | ORAL | Status: DC
  Administered 2019-05-15 – 2019-05-19 (×8): 237 mL via ORAL

## 2019-05-15 MED ORDER — INSULIN ASPART 100 UNIT/ML ~~LOC~~ SOLN
0.0000 [IU] | Freq: Three times a day (TID) | SUBCUTANEOUS | Status: DC
  Administered 2019-05-15: 11 [IU] via SUBCUTANEOUS
  Administered 2019-05-15: 20 [IU] via SUBCUTANEOUS
  Administered 2019-05-16: 7 [IU] via SUBCUTANEOUS
  Administered 2019-05-16: 15 [IU] via SUBCUTANEOUS
  Administered 2019-05-16: 11 [IU] via SUBCUTANEOUS
  Administered 2019-05-17: 20 [IU] via SUBCUTANEOUS
  Administered 2019-05-17: 4 [IU] via SUBCUTANEOUS
  Administered 2019-05-17: 15 [IU] via SUBCUTANEOUS
  Administered 2019-05-18: 20 [IU] via SUBCUTANEOUS
  Administered 2019-05-18: 7 [IU] via SUBCUTANEOUS
  Administered 2019-05-18 – 2019-05-19 (×2): 4 [IU] via SUBCUTANEOUS

## 2019-05-15 NOTE — Progress Notes (Addendum)
Inpatient Diabetes Program Recommendations  AACE/ADA: New Consensus Statement on Inpatient Glycemic Control (2015)  Target Ranges:  Prepandial:   less than 140 mg/dL      Peak postprandial:   less than 180 mg/dL (1-2 hours)      Critically ill patients:  140 - 180 mg/dL   Results for Victor Crawford, Victor Crawford (MRN [id number]) as of 05/15/2019 10:54  Ref. Range 05/14/2019 12:17 05/14/2019 17:17 05/14/2019 22:52 05/15/2019 07:51  Glucose-Capillary Latest Ref Range: 70 - 99 mg/dL 563 (H)  5 units NOVOLOG  282 (H)  8 units NOVOLOG +  14 units LEVEMIR given at 6:45pm  317 (H)  4 units NOVOLOG  273 (H)  8 units NOVOLOG +  14 units LEVEMIR given at 8:54am    Results for Victor Crawford (MRN [id number]) as of 05/15/2019 10:54  Ref. Range 05/14/2019 11:01  Hemoglobin A1C Latest Ref Range: 4.8 - 5.6 % 9.3 (H)  (220 mg/dl)   Admit with: Acute respiratory failure with hypoxia pneumonia due to COVID-19/ New Diagnosis of Diabertes  History: Morbid Obesity   Current Orders: Levemir 20 units BID      Novolog Resistant Correction Scale/ SSI (0-20 units) TID AC + HS      Novolog 4 units TID  PCP: None listed    Getting Decadron 6 mg Daily.  Note Levemir and Novolog SSi increased this AM.  Also note that Novolog Meal Coverage also to start today.  New Diagnosis of Diabetes this admission.    Plan to speak w/ pt today by phone to discuss new diagnosis of diabetes (if pt able and appropriate)    Addendum 2:30pm--Called into pt's room today to discuss new diagnosis of diabetes.  Pt was able to talk as O2 has been weaned slightly.  Pt answered all my questions but did not sound like he felt well enough to talk on the phone for an extended period.  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  Pt told me that Dr. Jerral Ralph stated he would probably not send him  home on Metformin.  Pt not sure if Dr. Jerral Ralph mentioned insulin for home.  Discussed w/ pt that we will have the RNs caring for him allow him to give all insulin injections here in the hospital for practice just on case decision is made to d/c pt home on insulin.  Discussed w/ pt that he will need to check his CBGs at home.  Discussed A1C results with him and explained what an A1C is, basic pathophysiology of DM Type 2, basic home care, basic diabetes diet nutrition principles, importance of checking CBGs and maintaining good CBG control to prevent long-term and short-term complications.    Patient gave me permission to call his girlfriend (Ms. Victor Crawford) by phone to discuss the above info.  Called Victor Crawford by phone today.  Reviewed all oft he above info with her as well.  [name] told me that pt sees Dr. Belva Crome with [facility] in [place].  [name] told me pt was told he had "boderline diabetes" about 6 months ago and was started on Metformin ED 500 mg BID.  Discussed w/ [name] that pt may require insulin at time of d/c home but that ultimately the discharging MD would decide this.  [name] told me she would be comfortable helping  Victor Crawford take insulin at home if needed as she assisted her Mom with giving insulin a few years back.  Discussed w/ Ms. Truman Hayward taht I will attach basic diabetes educational info to pt's d/c instructions and that I will place a RD consult for diabetes diet edu as well.  Pt's SO very appreciative of all the info.      --Will follow patient during hospitalization--  Wyn Quaker RN, MSN, CDE Diabetes Coordinator Inpatient Glycemic Control Team Team Pager: [phone] (8a-5p)

## 2019-05-15 NOTE — Progress Notes (Signed)
PROGRESS NOTE                                                                                                                                                                                                             Patient Demographics:    Victor Crawford, is a 44 y.o. male, DOB - [birth year], XTK:[id number]  Outpatient Primary MD for the patient is Patient, No Pcp Per   Admit date - 05/14/2019   LOS - 1  No chief complaint on file.      Brief Narrative: Patient is a 44 y.o. male with PMHx of HTN, DM-2 morbid obesity-who was diagnosed with COVID-19 on 3/20-presented as a transfer from [facility] health for worsening hypoxic respiratory failure in the setting of COVID-19 pneumonia.  Significant Events: 3/20>> diagnosed with COVID-19 at [facility] family practice in [place] 3/24>> presented to [facility] ED with shortness of breath-hypoxia secondary to COVID-19 pneumonia 3/24>> CT angio chest at [facility] ED-negative for PE. 3/25>> transferred to [facility]  COVID-19 medications: Steroids: 3/24>> Remdesivir: 3/24>> Actemra: 3/25 x1  Antibiotics: Rocephin: 3/24>>3/26 Zithromax: 3/24>> 3/26  Microbiology data: 3/25>> MRSA PCR positive  DVT prophylaxis: SQ Lovenox-change to BID dosing 3/26  Procedures: None  Consults: None    Subjective:    Kristine Linea today feels much better-he appears very comfortable.  He was titrated down 25 L/minute of heated high flow oxygen.  He does not have any shortness of breath at rest-easily talking in full sentences.   Assessment  & Plan :   Acute Hypoxic Resp Failure due to Covid 19 Viral pneumonia: Seems to be improving-O2 requirements have decreased slightly compared to yesterday remains on heated high flow this morning.  Chest x-ray on 3/26-personally reviewed-continues to show significant bilateral infiltrates.  We will continue at attempts to titrate down FiO2 further. He potentially could be  transition to salter high flow later today.  Continue steroids/remdesivir-patient is s/p Actemra on 3/25.  I do not think he has bacterial pneumonia-procalcitonin levels this morning is unremarkable-I will go ahead and discontinue Rocephin/Zithromax.  Fever: afebrile  O2 requirements:  SpO2: 95 % O2 Flow Rate (L/min): (S) 15 L/min(switched to Salter HFNC) FiO2 (%): 100 %   COVID-19 Labs: Recent Labs    05/14/19 1101 05/15/19 0817  DDIMER  --  15.62*  FERRITIN  --  1,175*  CRP  11.6* 6.3*       Component Value Date/Time   BNP 50.0 05/15/2019 0817    Recent Labs  Lab 05/15/19 0817  PROCALCITON 0.14    No results found for: SARSCOV2NAA   Prone/Incentive Spirometry: encouraged patient to lie prone for 3-4 hours at a time for a total of 16 hours a day, and to encourage incentive spirometry use 3-4/hour  Elevated D-dimer: Secondary to COVID-19-thankfully hypoxia improving.  CT angio chest on 3/24 at [facility]- for PE.  Have ordered a lower extremity Doppler.  Have asked pharmacy to switch patient over to intermediate dosing of Lovenox.  Transaminitis: Secondary to COVID-19-follow closely while on remdesivir.  HTN: BP controlled without the use of any antihypertensives-follow for now.  DM-2 with uncontrolled hyperglycemia secondary to steroids (A1c 9.3 on 3/25): Continue to hold Metformin-CBGs remain uncontrolled-increase Levemir to 20 units twice daily, change to resistant SSI-and add 4 units of NovoLog with meals.   Recent Labs    05/14/19 1717 05/14/19 2252 05/15/19 0751  GLUCAP 282* 317* 273*    Transaminitis: Secondary to COVID-19-follow closely while on remdesivir.  Probable OSA: I have recommended outpatient sleep study.  Currently on high flow oxygen-follow for now.  Obesity: Estimated body mass index is 51.47 kg/m as calculated from the following:   Height as of this encounter: 5\' 5"  (1.651 m).   Weight as of this encounter: 140.3 kg.    GI prophylaxis:  PPI  ABG:    Component Value Date/Time   PHART 7.424 05/14/2019 1055   PCO2ART 36.8 05/14/2019 1055   PO2ART 67.8 (L) 05/14/2019 1055   HCO3 23.8 05/14/2019 1055   TCO2 27 07/23/2015 1707   ACIDBASEDEF 0.1 05/14/2019 1055   O2SAT 92.7 05/14/2019 1055    Vent Settings:  FiO2 (%):  [100 %] 100 %  Condition - Extremely Guarded  Family Communication  : Called patient's significant other at his [employer] box is full.  Code Status :  Full Code  Diet :  Diet Order            Diet Heart Room service appropriate? Yes; Fluid consistency: Thin  Diet effective now               Disposition Plan  :  Remain hospitalized-Home when medically stable  Barriers to discharge: Severe hypoxia requiring O2 supplementation/complete 5 days of IV Remdesivir  Antimicorbials  :    Anti-infectives (From admission, onward)   Start     Dose/Rate Route Frequency Ordered Stop   05/15/19 1000  remdesivir 100 mg in sodium chloride 0.9 % 100 mL IVPB  Status:  Discontinued     100 mg 200 mL/hr over 30 Minutes Intravenous Daily 05/14/19 1124 05/14/19 1204   05/14/19 1400  remdesivir 100 mg in sodium chloride 0.9 % 100 mL IVPB     100 mg 200 mL/hr over 30 Minutes Intravenous Daily 05/14/19 1213 05/18/19 0959   05/14/19 1300  cefTRIAXone (ROCEPHIN) 2 g in sodium chloride 0.9 % 100 mL IVPB     2 g 200 mL/hr over 30 Minutes Intravenous Every 24 hours 05/14/19 1159 05/19/19 1259   05/14/19 1300  azithromycin (ZITHROMAX) tablet 500 mg     500 mg Oral Daily 05/14/19 1159 05/19/19 0959   05/14/19 1215  remdesivir 200 mg in sodium chloride 0.9% 250 mL IVPB  Status:  Discontinued     200 mg 580 mL/hr over 30 Minutes Intravenous Once 05/14/19 1124 05/14/19 1204  Inpatient Medications  Scheduled Meds: . albuterol  2 puff Inhalation Q6H  . vitamin C  500 mg Oral Daily  . azithromycin  500 mg Oral Daily  . Chlorhexidine Gluconate Cloth  6 each Topical Q0600  . dexamethasone  (DECADRON) injection  6 mg Intravenous Q24H  . enoxaparin (LOVENOX) injection  0.5 mg/kg Subcutaneous Q24H  . famotidine  20 mg Oral BID  . insulin aspart  0-15 Units Subcutaneous TID WC  . insulin aspart  0-5 Units Subcutaneous QHS  . insulin detemir  14 Units Subcutaneous BID  . mupirocin ointment  1 application Nasal BID  . zinc sulfate  220 mg Oral Daily   Continuous Infusions: . cefTRIAXone (ROCEPHIN)  IV 2 g (05/14/19 1336)  . remdesivir 100 mg in NS 100 mL 100 mg (05/15/19 0902)   PRN Meds:.acetaminophen, chlorpheniramine-HYDROcodone, guaiFENesin-dextromethorphan, ondansetron **OR** ondansetron (ZOFRAN) IV   Time Spent in minutes  45  The patient is critically ill with multiple organ system failure and requires high complexity decision making for assessment and support, frequent evaluation and titration of therapies, advanced monitoring, review of radiographic studies and interpretation of complex data.   See all Orders from today for further details   Jeoffrey Massed M.D on 05/15/2019 at 10:53 AM  To page go to [email or web address] - use universal password  [facility] -  Office  [phone]    Objective:   Vitals:   05/15/19 0200 05/15/19 0312 05/15/19 0753 05/15/19 0937  BP: 120/73 127/88 134/70   Pulse: (!) 53 (!) 56 69 79  Resp: 15 (!) 21 (!) 31 (!) 24  Temp:  98.8 F (37.1 C) 97.6 F (36.4 C)   TempSrc:  Axillary Axillary   SpO2: 93% 95% (!) 88% 95%  Weight:  (!) 140.3 kg    Height:        Wt Readings from Last 3 Encounters:  05/15/19 (!) 140.3 kg  12/08/15 122.9 kg     Intake/Output Summary (Last 24 hours) at 05/15/2019 1053 Last data filed at 05/14/2019 2100 Gross per 24 hour  Intake 680 ml  Output 600 ml  Net 80 ml     Physical Exam Gen Exam:Alert awake-not in any distress HEENT:atraumatic, normocephalic Chest: B/L clear to auscultation anteriorly CVS:S1S2 regular Abdomen:soft non tender, non distended Extremities:no edema Neurology:  Non focal Skin: no rash   Data Review:    CBC Recent Labs  Lab 05/14/19 1101 05/15/19 0817  WBC 5.5 11.7*  HGB 14.5 14.8  HCT 44.4 45.2  PLT 178 210  MCV 83.1 84.6  MCH 27.2 27.7  MCHC 32.7 32.7  RDW 15.8* 15.9*  LYMPHSABS 0.6* 1.3  MONOABS 0.1 0.7  EOSABS 0.0 0.0  BASOSABS 0.0 0.0    Chemistries  Recent Labs  Lab 05/14/19 1101 05/15/19 0817  NA 138 139  K 4.3 4.5  CL 104 102  CO2 24 25  GLUCOSE 321* 323*  BUN 13 20  CREATININE 0.98 0.97  CALCIUM 8.3* 8.6*  AST 169* 79*  ALT 186* 144*  ALKPHOS 89 93  BILITOT 0.6 0.6   ------------------------------------------------------------------------------------------------------------------ No results for input(s): CHOL, HDL, LDLCALC, TRIG, CHOLHDL, LDLDIRECT in the last 72 hours.  Lab Results  Component Value Date   HGBA1C 9.3 (H) 05/14/2019   ------------------------------------------------------------------------------------------------------------------ No results for input(s): TSH, T4TOTAL, T3FREE, THYROIDAB in the last 72 hours.  Invalid input(s): FREET3 ------------------------------------------------------------------------------------------------------------------ Recent Labs    05/15/19 0817  FERRITIN 1,175*    Coagulation profile No results for input(s): INR, PROTIME  in the last 168 hours.  Recent Labs    05/15/19 0817  DDIMER 15.62*    Cardiac Enzymes No results for input(s): CKMB, TROPONINI, MYOGLOBIN in the last 168 hours.  Invalid input(s): CK ------------------------------------------------------------------------------------------------------------------    Component Value Date/Time   BNP 50.0 05/15/2019 8242    Micro Results Recent Results (from the past 240 hour(s))  MRSA PCR Screening     Status: Abnormal   Collection Time: 05/14/19  1:45 PM   Specimen: Nasal Mucosa; Nasopharyngeal  Result Value Ref Range Status   MRSA by PCR POSITIVE (A) NEGATIVE Final    Comment:         The GeneXpert MRSA Assay (FDA approved for NASAL specimens only), is one component of a comprehensive MRSA colonization surveillance program. It is not intended to diagnose MRSA infection nor to guide or monitor treatment for MRSA infections. RESULT CALLED TO, READ BACK BY AND VERIFIED WITH: Carmon Ginsberg RN 16:00 05/14/19 ([email or web address]) Performed at [facility], [address], [place], [place] [address]     Radiology Reports DG Chest [place] 1V same Day  Result Date: 05/15/2019 CLINICAL DATA:  Shortness of breath. EXAM: PORTABLE CHEST 1 VIEW COMPARISON:  05/13/2019. FINDINGS: Cardiomegaly. Progressive diffuse bilateral pulmonary interstitial prominence. Interstitial edema and/or pneumonitis could present this fashion. Small right pleural effusion cannot be excluded. No pneumothorax. No acute bony abnormality. IMPRESSION: Cardiomegaly. Progressive diffuse bilateral from interstitial prominence. Interstitial edema and/or pneumonitis could present this fashion. Small right pleural effusion cannot be excluded. Electronically Signed   By: Maisie Fus  Register   On: 05/15/2019 07:39

## 2019-05-15 NOTE — Plan of Care (Signed)
  Problem: Education: Goal: Knowledge of General Education information will improve Description Including pain rating scale, medication(s)/side effects and non-pharmacologic comfort measures Outcome: Progressing   Problem: Health Behavior/Discharge Planning: Goal: Ability to manage health-related needs will improve Outcome: Progressing   

## 2019-05-15 NOTE — Progress Notes (Signed)
Initial Nutrition Assessment  DOCUMENTATION CODES:   Morbid obesity  INTERVENTION:  Provide Glucerna Shake po TID, each supplement provides 220 kcal and 10 grams of protein  Provide 30 ml Prostat po BID, each supplement provides 100 kcal and 15 grams of protein.   Diet handout placed in discharge instructions.   NUTRITION DIAGNOSIS:   Increased nutrient needs related to acute illness(COVID) as evidenced by estimated needs.  GOAL:   Patient will meet greater than or equal to 90% of their needs  MONITOR:   PO intake, Supplement acceptance, Skin, Weight trends, Labs, I & O's  REASON FOR ASSESSMENT:   Consult Diet education  ASSESSMENT:   44 y.o. male with PMHx of HTN, DM-2 morbid obesity-who was diagnosed with COVID-19 on 3/20-presented as a transfer from [facility] health for worsening hypoxic respiratory failure in the setting of COVID-19 pneumonia.  RD working remotely.  Pt unavailable during attempted time of contact. Meal completion has been 10%. RD to order nutritional supplements to aid in caloric and protein needs. Noted pt with new diagnosis diabetes.  Handout "Carbohydrate Counting for People with Diabetes" from the Academy of Nutrition and Dietetics Manual placed in pt discharge instructions. RD contacted pt's significant other, Victor Crawford. Patient diet recall obtained. She reports pt consumes large amounts of fried foods and sugar sweetened food items and beverages. Discussed different food groups and their effects on blood sugar, emphasizing carbohydrate-containing foods. Provided list of carbohydrates and recommended serving sizes of common foods. Provided examples of ways to balance meals/snacks and encouraged intake of high-fiber, whole grain complex carbohydrates. [name] verbalized understanding of information discussed.   Unable to complete Nutrition-Focused physical exam at this time.   Labs and medications reviewed.   Diet Order:   Diet Order           Diet Heart Room service appropriate? Yes; Fluid consistency: Thin  Diet effective now              EDUCATION NEEDS:   Education needs have been addressed  Skin:  Skin Assessment: Reviewed RN Assessment  Last BM:  3/26  Height:   Ht Readings from Last 1 Encounters:  05/14/19 5\' 5"  (1.651 m)    Weight:   Wt Readings from Last 1 Encounters:  05/15/19 (!) 140.3 kg    Ideal Body Weight:  61.8 kg  BMI:  Body mass index is 51.47 kg/m.  Estimated Nutritional Needs:   Kcal:  2000-2200  Protein:  100-120 grams  Fluid:  >/= 2 L/day    05/17/19, MS, RD, LDN RD pager number/after hours weekend pager number on Amion.

## 2019-05-15 NOTE — Progress Notes (Signed)
Bilateral lower extremity venous duplex exam completed.  Preliminary results can be found under CV proc under chart review.  05/15/2019 1:47 PM  Branndon Tuite, K., RDMS, RVT

## 2019-05-16 LAB — CBC WITH DIFFERENTIAL/PLATELET
Abs Immature Granulocytes: 0.2 10*3/uL — ABNORMAL HIGH (ref 0.00–0.07)
Basophils Absolute: 0 10*3/uL (ref 0.0–0.1)
Basophils Relative: 0 %
Eosinophils Absolute: 0 10*3/uL (ref 0.0–0.5)
Eosinophils Relative: 0 %
HCT: 45.4 % (ref 39.0–52.0)
Hemoglobin: 14.9 g/dL (ref 13.0–17.0)
Immature Granulocytes: 1 %
Lymphocytes Relative: 11 %
Lymphs Abs: 1.5 10*3/uL (ref 0.7–4.0)
MCH: 27.7 pg (ref 26.0–34.0)
MCHC: 32.8 g/dL (ref 30.0–36.0)
MCV: 84.5 fL (ref 80.0–100.0)
Monocytes Absolute: 0.7 10*3/uL (ref 0.1–1.0)
Monocytes Relative: 5 %
Neutro Abs: 11.8 10*3/uL — ABNORMAL HIGH (ref 1.7–7.7)
Neutrophils Relative %: 83 %
Platelets: 185 10*3/uL (ref 150–400)
RBC: 5.37 MIL/uL (ref 4.22–5.81)
RDW: 15.8 % — ABNORMAL HIGH (ref 11.5–15.5)
WBC: 14.3 10*3/uL — ABNORMAL HIGH (ref 4.0–10.5)
nRBC: 0.2 % (ref 0.0–0.2)

## 2019-05-16 LAB — COMPREHENSIVE METABOLIC PANEL
ALT: 121 U/L — ABNORMAL HIGH (ref 0–44)
AST: 67 U/L — ABNORMAL HIGH (ref 15–41)
Albumin: 2.7 g/dL — ABNORMAL LOW (ref 3.5–5.0)
Alkaline Phosphatase: 106 U/L (ref 38–126)
Anion gap: 11 (ref 5–15)
BUN: 21 mg/dL — ABNORMAL HIGH (ref 6–20)
CO2: 24 mmol/L (ref 22–32)
Calcium: 8.9 mg/dL (ref 8.9–10.3)
Chloride: 105 mmol/L (ref 98–111)
Creatinine, Ser: 1.07 mg/dL (ref 0.61–1.24)
GFR calc Af Amer: >60 mL/min (ref >60)
GFR calc non Af Amer: >60 mL/min (ref >60)
Glucose, Bld: 285 mg/dL — ABNORMAL HIGH (ref 70–99)
Potassium: 4.5 mmol/L (ref 3.5–5.1)
Sodium: 140 mmol/L (ref 135–145)
Total Bilirubin: 0.5 mg/dL (ref 0.3–1.2)
Total Protein: 7 g/dL (ref 6.5–8.1)

## 2019-05-16 LAB — GLUCOSE, CAPILLARY
Glucose-Capillary: 236 mg/dL — ABNORMAL HIGH (ref 70–99)
Glucose-Capillary: 317 mg/dL — ABNORMAL HIGH (ref 70–99)
Glucose-Capillary: 276 mg/dL — ABNORMAL HIGH (ref 70–99)
Glucose-Capillary: 354 mg/dL — ABNORMAL HIGH (ref 70–99)

## 2019-05-16 LAB — PROCALCITONIN: Procalcitonin: 0.23 ng/mL

## 2019-05-16 LAB — BRAIN NATRIURETIC PEPTIDE: B Natriuretic Peptide: 136.8 pg/mL — ABNORMAL HIGH (ref 0.0–100.0)

## 2019-05-16 LAB — C-REACTIVE PROTEIN: CRP: 2.7 mg/dL — ABNORMAL HIGH (ref <1.0)

## 2019-05-16 LAB — MAGNESIUM: Magnesium: 2.4 mg/dL (ref 1.7–2.4)

## 2019-05-16 LAB — FERRITIN: Ferritin: 952 ng/mL — ABNORMAL HIGH (ref 24–336)

## 2019-05-16 LAB — D-DIMER, QUANTITATIVE: D-Dimer, Quant: 13.11 ug/mL-FEU — ABNORMAL HIGH (ref 0.00–0.50)

## 2019-05-16 MED ORDER — FUROSEMIDE 10 MG/ML IJ SOLN
40.0000 mg | Freq: Once | INTRAMUSCULAR | Status: AC
  Administered 2019-05-16: 40 mg via INTRAVENOUS
  Filled 2019-05-16: qty 4

## 2019-05-16 MED ORDER — CARVEDILOL 3.125 MG PO TABS
3.1250 mg | ORAL_TABLET | Freq: Two times a day (BID) | ORAL | Status: DC
  Administered 2019-05-16 – 2019-05-19 (×6): 3.125 mg via ORAL
  Filled 2019-05-16 (×6): qty 1

## 2019-05-16 NOTE — Plan of Care (Signed)

## 2019-05-16 NOTE — Evaluation (Signed)
Physical Therapy Evaluation Patient Details Name: Victor Crawford MRN: [id number] DOB: [birth year] Today's Date: 05/16/2019   History of Present Illness  44 y.o. male with PMHx of HTN, DM-2 morbid obesity-who was diagnosed with COVID-19 at [facility] family practice on 3/20-presented as a transfer from [facility] 05/14/19 for worsening hypoxic respiratory failure in the setting of COVID-19 pneumonia.    Clinical Impression  PTA pt living with wife in single story home with level entry. 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 Pt is limited in safe mobility by decreased strength and endurance. Pt is currently min guard for transfers and able to progress to supervision with ambulation without AD. Pt will not have any additional therapy or equipment needs at discharge, however PT will continue to see acutely to build strength and endurance for safe discharge home.     Follow Up Recommendations No PT follow up    Equipment Recommendations  None recommended by PT       Precautions / Restrictions Precautions Precautions: Other (comment) Precaution Comments: watch O2 sats Restrictions Weight Bearing Restrictions: No      Mobility  Bed Mobility               General bed mobility comments: OOB in recliner on entry   Transfers Overall transfer level: Needs assistance Equipment used: Rolling walker (2 wheeled) Transfers: Sit to/from Stand Sit to Stand: Min guard         General transfer comment: min guard for safety with good power up and steadying  Ambulation/Gait Ambulation/Gait assistance: Min guard;Supervision Gait Distance (Feet): 400 Feet Assistive device: Rolling walker (2 wheeled);None Gait Pattern/deviations: Step-through pattern;WFL(Within Functional Limits) Gait velocity: slowed Gait velocity interpretation: 1.31 - 2.62 ft/sec, indicative of limited community ambulator General Gait Details: min guard for safety with RW ambulation for 40  feet, pt with good stability so RW removed and pt able to ambulate with steady, waddling gait and progress from min guard to supervision for safety        Balance Overall balance assessment: Mild deficits observed, not formally tested                                           Pertinent Vitals/Pain Pain Assessment: No/denies pain    Home Living Family/patient expects to be discharged to:: Private residence Living Arrangements: Spouse/significant other Available Help at Discharge: Family;Available 24 hours/day Type of Home: House Home Access: Level entry     Home Layout: One level Home Equipment: None      Prior Function Level of Independence: Independent         Comments: driving, working (drives dump truck)        Extremity/Trunk Assessment   Upper Extremity Assessment Upper Extremity Assessment: Defer to OT evaluation    Lower Extremity Assessment Lower Extremity Assessment: Overall WFL for tasks assessed       Communication   Communication: No difficulties  Cognition Arousal/Alertness: Awake/alert Behavior During Therapy: WFL for tasks assessed/performed Overall Cognitive Status: Within Functional Limits for tasks assessed                                        General Comments General comments (skin integrity, edema, etc.): Pt on 3L O2 via [place] with SaO2 at  97%O2 at rest, difficulty maintaining pleth waveform with ambulation but with return to room quickly rebounded to 99%O2    Exercises Other Exercises Other Exercises: flutter valve x 5 Other Exercises: IS x 8, max inhalation 1875 mL   Assessment/Plan    PT Assessment Patient needs continued PT services  PT Problem List Decreased activity tolerance;Cardiopulmonary status limiting activity       PT Treatment Interventions DME instruction;Gait training;Functional mobility training;Therapeutic activities;Therapeutic exercise;Balance training;Cognitive remediation     PT Goals (Current goals can be found in the Care Plan section)  Acute Rehab PT Goals Patient Stated Goal: go home PT Goal Formulation: With patient Time For Goal Achievement: 05/30/19 Potential to Achieve Goals: Good    Frequency Min 3X/week   Barriers to discharge        Co-evaluation PT/OT/SLP Co-Evaluation/Treatment: Yes Reason for Co-Treatment: Complexity of the patient's impairments (multi-system involvement);For patient/therapist safety PT goals addressed during session: Mobility/safety with mobility         AM-PAC PT "6 Clicks" Mobility  Outcome Measure Help needed turning from your back to your side while in a flat bed without using bedrails?: None Help needed moving from lying on your back to sitting on the side of a flat bed without using bedrails?: A Little Help needed moving to and from a bed to a chair (including a wheelchair)?: None Help needed standing up from a chair using your arms (e.g., wheelchair or bedside chair)?: None Help needed to walk in hospital room?: None Help needed climbing 3-5 steps with a railing? : A Little 6 Click Score: 22    End of Session Equipment Utilized During Treatment: Gait belt;Oxygen Activity Tolerance: Patient tolerated treatment well Patient left: in chair;with call bell/phone within reach Nurse Communication: Mobility status PT Visit Diagnosis: Muscle weakness (generalized) (M62.81);Other abnormalities of gait and mobility (R26.89)    Time: 5784-6962 PT Time Calculation (min) (ACUTE ONLY): 20 min   Charges:   PT Evaluation $PT Eval Moderate Complexity: 1 Mod          Jaquala Fuller B. Beverely Risen PT, DPT Acute Rehabilitation Services Pager [phone] Office [phone]   Elon Alas Fleet 05/16/2019, 5:09 PM

## 2019-05-16 NOTE — Evaluation (Signed)
Occupational Therapy Evaluation Patient Details Name: Victor Crawford MRN: [id number] DOB: Aug 20, 1975 Today's Date: 05/16/2019    History of Present Illness 44 y.o. male with PMHx of HTN, DM-2 morbid obesity-who was diagnosed with COVID-19 at [facility] family practice on 3/20-presented as a transfer from [facility] 05/14/19 for worsening hypoxic respiratory failure in the setting of COVID-19 pneumonia.     Clinical Impression   This 44 y/o male presents with the above. PTA pt reports independence with ADL, iADL and functional mobility. Pt completing room and hallway level mobility today initially with RW progressed to no AD at minguard-supervision level. He is currently at supervision-mod independent level for ADL tasks. Initiated education of energy conservation strategies to utilize during ADL/mobility tasks. Pt will benefit from continued acute OT services to maximize his overall strength, endurance, safety and independence with ADL and mobility. Will follow.     Follow Up Recommendations  No OT follow up;Supervision - Intermittent    Equipment Recommendations  None recommended by OT           Precautions / Restrictions Precautions Precautions: Other (comment) Precaution Comments: watch O2 sats Restrictions Weight Bearing Restrictions: No      Mobility Bed Mobility               General bed mobility comments: OOB in recliner on entry   Transfers Overall transfer level: Needs assistance Equipment used: Rolling walker (2 wheeled) Transfers: Sit to/from Stand Sit to Stand: Min guard         General transfer comment: min guard for safety with good power up and steadying    Balance Overall balance assessment: Mild deficits observed, not formally tested                                         ADL either performed or assessed with clinical judgement   ADL Overall ADL's : Needs assistance/impaired                                      Functional mobility during ADLs: Supervision/safety General ADL Comments: pt performing mobility/functional tasks at supervision level today without AD, initiated education of energy conservation strategies to implement during ADL/mobility tasks                          Pertinent Vitals/Pain Pain Assessment: No/denies pain     Hand Dominance     Extremity/Trunk Assessment Upper Extremity Assessment Upper Extremity Assessment: Overall WFL for tasks assessed   Lower Extremity Assessment Lower Extremity Assessment: Defer to PT evaluation;Overall WFL for tasks assessed       Communication Communication Communication: No difficulties   Cognition Arousal/Alertness: Awake/alert Behavior During Therapy: WFL for tasks assessed/performed Overall Cognitive Status: Within Functional Limits for tasks assessed                                     General Comments  Pt on 3L O2 via [place] with SaO2 at 97%O2 at rest, difficulty maintaining pleth waveform with ambulation but with return to room quickly rebounded to 99%O2    Exercises Exercises: Other exercises Other Exercises Other Exercises: flutter valve x 5 Other Exercises: IS x 8, max inhalation  1875 mL   Shoulder Instructions      Home Living Family/patient expects to be discharged to:: Private residence Living Arrangements: Spouse/significant other Available Help at Discharge: Family;Available 24 hours/day Type of Home: House Home Access: Level entry     Home Layout: One level     Bathroom Shower/Tub: Teacher, early years/pre: Standard     Home Equipment: None          Prior Functioning/Environment Level of Independence: Independent        Comments: driving, working (drives dump truck)        OT Problem List: Cardiopulmonary status limiting activity;Obesity;Decreased activity tolerance      OT Treatment/Interventions: Self-care/ADL training;Therapeutic exercise;Neuromuscular  education;Energy conservation;DME and/or AE instruction;Therapeutic activities;Patient/family education;Balance training    OT Goals(Current goals can be found in the care plan section) Acute Rehab OT Goals Patient Stated Goal: go home OT Goal Formulation: With patient Time For Goal Achievement: 05/30/19 Potential to Achieve Goals: Good  OT Frequency: Min 2X/week   Barriers to D/C:            Co-evaluation PT/OT/SLP Co-Evaluation/Treatment: Yes Reason for Co-Treatment: Complexity of the patient's impairments (multi-system involvement);To address functional/ADL transfers;For patient/therapist safety PT goals addressed during session: Mobility/safety with mobility OT goals addressed during session: Strengthening/ROM      AM-PAC OT "6 Clicks" Daily Activity     Outcome Measure Help from another person eating meals?: None Help from another person taking care of personal grooming?: None Help from another person toileting, which includes using toliet, bedpan, or urinal?: None Help from another person bathing (including washing, rinsing, drying)?: None Help from another person to put on and taking off regular upper body clothing?: None Help from another person to put on and taking off regular lower body clothing?: A Little 6 Click Score: 23   End of Session Equipment Utilized During Treatment: Oxygen Nurse Communication: Mobility status  Activity Tolerance: Patient tolerated treatment well Patient left: in chair;with call bell/phone within reach  OT Visit Diagnosis: Other (comment)(decreased activity tolerance)                Time: 0102-7253 OT Time Calculation (min): 20 min Charges:  OT General Charges $OT Visit: 1 Visit OT Evaluation $OT Eval Moderate Complexity: 1 Mod  Victor Crawford, OT Acute Rehabilitation Services Pager [phone] Office [phone]    Victor Crawford 05/16/2019, 5:29 PM

## 2019-05-16 NOTE — Progress Notes (Signed)
PROGRESS NOTE                                                                                                                                                                                                             Patient Demographics:    Victor Crawford, is a 44 y.o. male, DOB - 1975-12-01, ZOX:[id number]  Outpatient Primary MD for the patient is Patient, No Pcp Per   Admit date - 05/14/2019   LOS - 2  No chief complaint on file.      Brief Narrative: Patient is a 44 y.o. male with PMHx of HTN, DM-2 morbid obesity-who was diagnosed with COVID-19 on 3/20-presented as a transfer from [facility] health for worsening hypoxic respiratory failure in the setting of COVID-19 pneumonia.  Significant Events: 3/20>> diagnosed with COVID-19 at [facility] family practice in [place] 3/24>> presented to [facility] ED with shortness of breath-hypoxia secondary to COVID-19 pneumonia 3/24>> CT angio chest at [facility] ED-negative for PE. 3/25>> transferred to [facility]  COVID-19 medications: Steroids: 3/24>> Remdesivir: 3/24>> Actemra: 3/25 x1  Antibiotics: Rocephin: 3/24>>3/26 Zithromax: 3/24>> 3/26  Microbiology data: 3/25>> MRSA PCR positive  DVT prophylaxis: SQ Lovenox-change to BID dosing 3/26  Procedures: None  Consults: None    Subjective:   Patient in bed, appears comfortable, denies any headache, no fever, no chest pain or pressure, no shortness of breath , no abdominal pain. No focal weakness.    Assessment  & Plan :   Acute Hypoxic Resp Failure due to Covid 19 Viral pneumonia: He had severe disease and was treated appropriately with Actemra on 05/14/2019, continue steroids and remdesivir, no antibiotics needed.  Encouraged to sit up in chair advance activity and titrate down oxygen.  Encouraged to prone at night.  Continue to use I-S and flutter valve for pulmonary toiletry.  Gradually improving oxygen requirements down from 15 L  heated high flow to 5 L nasal cannula.  O2 requirements:  SpO2: 93 % O2 Flow Rate (L/min): 5 L/min FiO2 (%): 100 %   COVID-19 Labs: Recent Labs    05/14/19 1101 05/15/19 0817 05/16/19 0218  DDIMER  --  15.62* 13.11*  FERRITIN  --  1,175* 952*  CRP 11.6* 6.3* 2.7*       Component Value Date/Time   BNP 136.8 (H) 05/16/2019 0218    Recent Labs  Lab  05/15/19 0817 05/16/19 0218  PROCALCITON 0.14 0.23    No results found for: SARSCOV2NAA   Prone/Incentive Spirometry: encouraged patient to lie prone for 3-4 hours at a time for a total of 16 hours a day, and to encourage incentive spirometry use 3-4/hour  Elevated D-dimer: Secondary to COVID-19-thankfully hypoxia improving.  [facility]  Negative lower extremity duplex.  Continue intermediate dose Lovenox for now.  D-dimer now trending down.  Transaminitis: Secondary to COVID-19-follow closely while on remdesivir.  HTN: BP controlled without the use of any antihypertensives-follow for now.  DM-2 with uncontrolled hyperglycemia secondary to steroids (A1c 9.3 on 3/25): Continue to hold Metformin-CBGs remain uncontrolled-increase Levemir to 20 units twice daily, change to resistant SSI-and add 4 units of NovoLog with meals.   Recent Labs    05/15/19 1633 05/15/19 2133 05/16/19 0734  GLUCAP 368* 325* 236*    Transaminitis: Secondary to COVID-19-follow closely while on remdesivir.  Probable OSA: I have recommended outpatient sleep study.  Currently on high flow oxygen-follow for now.  Obesity: BMI 51 follow with PCP for weight loss.     GI prophylaxis: PPI       Condition - Fair   Family Communication  : Loraine MapleCalled Melissa [phone] - 05/16/2019 at 11 AM.  Full mailbox.  Code Status :  Full Code  Diet :  Diet Order            Diet Carb Modified Fluid consistency: Thin; Room service appropriate? Yes  Diet effective now               Disposition Plan  :  Remain hospitalized-Home  when medically stable  Barriers to discharge: Severe hypoxia requiring O2 supplementation/complete 5 days of IV Remdesivir  Antimicorbials  :    Anti-infectives (From admission, onward)   Start     Dose/Rate Route Frequency Ordered Stop   05/15/19 1000  remdesivir 100 mg in sodium chloride 0.9 % 100 mL IVPB  Status:  Discontinued     100 mg 200 mL/hr over 30 Minutes Intravenous Daily 05/14/19 1124 05/14/19 1204   05/14/19 1400  remdesivir 100 mg in sodium chloride 0.9 % 100 mL IVPB     100 mg 200 mL/hr over 30 Minutes Intravenous Daily 05/14/19 1213 05/18/19 0959   05/14/19 1300  cefTRIAXone (ROCEPHIN) 2 g in sodium chloride 0.9 % 100 mL IVPB  Status:  Discontinued     2 g 200 mL/hr over 30 Minutes Intravenous Every 24 hours 05/14/19 1159 05/15/19 1057   05/14/19 1300  azithromycin (ZITHROMAX) tablet 500 mg  Status:  Discontinued     500 mg Oral Daily 05/14/19 1159 05/15/19 1057   05/14/19 1215  remdesivir 200 mg in sodium chloride 0.9% 250 mL IVPB  Status:  Discontinued     200 mg 580 mL/hr over 30 Minutes Intravenous Once 05/14/19 1124 05/14/19 1204      Inpatient Medications  Scheduled Meds: . albuterol  2 puff Inhalation Q6H  . vitamin C  500 mg Oral Daily  . Chlorhexidine Gluconate Cloth  6 each Topical Q0600  . dexamethasone (DECADRON) injection  6 mg Intravenous Q24H  . enoxaparin (LOVENOX) injection  0.5 mg/kg Subcutaneous Q12H  . famotidine  20 mg Oral BID  . feeding supplement (GLUCERNA SHAKE)  237 mL Oral TID BM  . feeding supplement (PRO-STAT SUGAR FREE 64)  30 mL Oral BID  . insulin aspart  0-20 Units Subcutaneous TID WC  . insulin  aspart  0-5 Units Subcutaneous QHS  . insulin aspart  4 Units Subcutaneous TID WC  . insulin detemir  20 Units Subcutaneous BID  . mupirocin ointment  1 application Nasal BID  . zinc sulfate  220 mg Oral Daily   Continuous Infusions: . remdesivir 100 mg in NS 100 mL 100 mg (05/16/19 0909)   PRN Meds:.acetaminophen,  chlorpheniramine-HYDROcodone, guaiFENesin-dextromethorphan, ondansetron **OR** ondansetron (ZOFRAN) IV   Time Spent in minutes  45  The patient is critically ill with multiple organ system failure and requires high complexity decision making for assessment and support, frequent evaluation and titration of therapies, advanced monitoring, review of radiographic studies and interpretation of complex data.   See all Orders from today for further details   Susa Raring M.D on 05/16/2019 at 11:00 AM  To page go to [email or web address] - use universal password  [facility] -  Office  [phone]    Objective:   Vitals:   05/15/19 0937 05/15/19 1200 05/15/19 1636 05/16/19 0758  BP:    137/85  Pulse: 79 88 98 85  Resp: (!) 24 (!) 21 15 (!) 28  Temp:   98 F (36.7 C) 97.8 F (36.6 C)  TempSrc:   Oral Oral  SpO2: 95% 100% 94% 93%  Weight:      Height:        Wt Readings from Last 3 Encounters:  05/15/19 (!) 140.3 kg  12/08/15 122.9 kg     Intake/Output Summary (Last 24 hours) at 05/16/2019 1100 Last data filed at 05/15/2019 2300 Gross per 24 hour  Intake 480 ml  Output --  Net 480 ml     Physical Exam  Awake Alert, No new F.N deficits, Normal affect [place].AT,PERRAL Supple Neck,No JVD, No cervical lymphadenopathy appriciated.  Symmetrical Chest wall movement, Good air movement bilaterally, CTAB RRR,No Gallops, Rubs or new Murmurs, No Parasternal Heave +ve B.Sounds, Abd Soft, No tenderness, No organomegaly appriciated, No rebound - guarding or rigidity. No Cyanosis, Clubbing or edema, No new Rash or bruise    Data Review:    CBC Recent Labs  Lab 05/14/19 1101 05/15/19 0817 05/16/19 0218  WBC 5.5 11.7* 14.3*  HGB 14.5 14.8 14.9  HCT 44.4 45.2 45.4  PLT 178 210 185  MCV 83.1 84.6 84.5  MCH 27.2 27.7 27.7  MCHC 32.7 32.7 32.8  RDW 15.8* 15.9* 15.8*  LYMPHSABS 0.6* 1.3 1.5  MONOABS 0.1 0.7 0.7  EOSABS 0.0 0.0 0.0  BASOSABS 0.0 0.0 0.0    Chemistries   Recent Labs  Lab 05/14/19 1101 05/15/19 0817 05/16/19 0218  NA 138 139 140  K 4.3 4.5 4.5  CL 104 102 105  CO2 24 25 24   GLUCOSE 321* 323* 285*  BUN 13 20 21*  CREATININE 0.98 0.97 1.07  CALCIUM 8.3* 8.6* 8.9  AST 169* 79* 67*  ALT 186* 144* 121*  ALKPHOS 89 93 106  BILITOT 0.6 0.6 0.5   ------------------------------------------------------------------------------------------------------------------ No results for input(s): CHOL, HDL, LDLCALC, TRIG, CHOLHDL, LDLDIRECT in the last 72 hours.  Lab Results  Component Value Date   HGBA1C 9.3 (H) 05/14/2019   ------------------------------------------------------------------------------------------------------------------ No results for input(s): TSH, T4TOTAL, T3FREE, THYROIDAB in the last 72 hours.  Invalid input(s): FREET3 ------------------------------------------------------------------------------------------------------------------ Recent Labs    05/15/19 0817 05/16/19 0218  FERRITIN 1,175* 952*    Coagulation profile No results for input(s): INR, PROTIME in the last 168 hours.  Recent Labs    05/15/19 0817 05/16/19 0218  DDIMER 15.62* 13.11*  Cardiac Enzymes No results for input(s): CKMB, TROPONINI, MYOGLOBIN in the last 168 hours.  Invalid input(s): CK ------------------------------------------------------------------------------------------------------------------    Component Value Date/Time   BNP 136.8 (H) 05/16/2019 0218    Micro Results Recent Results (from the past 240 hour(s))  MRSA PCR Screening     Status: Abnormal   Collection Time: 05/14/19  1:45 PM   Specimen: Nasal Mucosa; Nasopharyngeal  Result Value Ref Range Status   MRSA by PCR POSITIVE (A) NEGATIVE Final    Comment:        The GeneXpert MRSA Assay (FDA approved for NASAL specimens only), is one component of a comprehensive MRSA colonization surveillance program. It is not intended to diagnose MRSA infection nor to guide  or monitor treatment for MRSA infections. RESULT CALLED TO, READ BACK BY AND VERIFIED WITH: Carmon Ginsberg RN 16:00 05/14/19 ([email or web address]) Performed at [facility], [address] [place], [place] [address]     Radiology Reports DG Chest [place] 1V same Day  Result Date: 05/15/2019 CLINICAL DATA:  Shortness of breath. EXAM: PORTABLE CHEST 1 VIEW COMPARISON:  05/13/2019. FINDINGS: Cardiomegaly. Progressive diffuse bilateral pulmonary interstitial prominence. Interstitial edema and/or pneumonitis could present this fashion. Small right pleural effusion cannot be excluded. No pneumothorax. No acute bony abnormality. IMPRESSION: Cardiomegaly. Progressive diffuse bilateral from interstitial prominence. Interstitial edema and/or pneumonitis could present this fashion. Small right pleural effusion cannot be excluded. Electronically Signed   By: Maisie Fus  Register   On: 05/15/2019 07:39   VAS [place] LOWER EXTREMITY VENOUS (DVT)  Result Date: 05/15/2019  Lower Venous DVTStudy Indications: Swelling, and Covid+, Elevated D-dimer.  Limitations: Body habitus and poor ultrasound/tissue interface. Comparison Study: No prior exam. Performing Technologist: Kennedy Bucker ARDMS, RVT  Examination Guidelines: A complete evaluation includes B-mode imaging, spectral Doppler, color Doppler, and power Doppler as needed of all accessible portions of each vessel. Bilateral testing is considered an integral part of a complete examination. Limited examinations for reoccurring indications may be performed as noted. The reflux portion of the exam is performed with the patient in reverse Trendelenburg.  +---------+---------------+---------+-----------+----------+------------------+ RIGHT    CompressibilityPhasicitySpontaneityPropertiesThrombus Aging     +---------+---------------+---------+-----------+----------+------------------+ CFV      Full           Yes      Yes                                      +---------+---------------+---------+-----------+----------+------------------+ SFJ      Full                                                            +---------+---------------+---------+-----------+----------+------------------+ FV Prox  Full                                                            +---------+---------------+---------+-----------+----------+------------------+ FV Mid   Full  visualized with                                                          color              +---------+---------------+---------+-----------+----------+------------------+ FV DistalFull                                         visualized with                                                          color              +---------+---------------+---------+-----------+----------+------------------+ PFV      Full                                                            +---------+---------------+---------+-----------+----------+------------------+ POP      Full           Yes      Yes                                     +---------+---------------+---------+-----------+----------+------------------+ PTV      Full                                         visualized with                                                          color              +---------+---------------+---------+-----------+----------+------------------+ PERO     Partial                                      visualized with                                                          color              +---------+---------------+---------+-----------+----------+------------------+   +---------+---------------+---------+-----------+----------+------------------+ LEFT     CompressibilityPhasicitySpontaneityPropertiesThrombus Aging     +---------+---------------+---------+-----------+----------+------------------+ CFV      Full            Yes      Yes                                     +---------+---------------+---------+-----------+----------+------------------+  SFJ      Full                                                            +---------+---------------+---------+-----------+----------+------------------+ FV Prox  Full                                         visualized with                                                          color              +---------+---------------+---------+-----------+----------+------------------+ FV Mid   Full                                         visualized with                                                          color              +---------+---------------+---------+-----------+----------+------------------+ FV DistalFull                                         visualized with                                                          color              +---------+---------------+---------+-----------+----------+------------------+ PFV      Full                                                            +---------+---------------+---------+-----------+----------+------------------+ POP      Full           Yes      Yes                                     +---------+---------------+---------+-----------+----------+------------------+ PTV      Full                                         visualized with  color              +---------+---------------+---------+-----------+----------+------------------+ PERO     Full                                         visualized with                                                          color              +---------+---------------+---------+-----------+----------+------------------+     Summary: RIGHT: - There is no evidence of deep vein thrombosis in the lower extremity. However, portions of this examination were  limited- see technologist comments above.  - No cystic structure found in the popliteal fossa.  LEFT: - There is no evidence of deep vein thrombosis in the lower extremity. However, portions of this examination were limited- see technologist comments above.  - No cystic structure found in the popliteal fossa.  *See table(s) above for measurements and observations. Electronically signed by Waverly Ferrari MD on 05/15/2019 at 4:51:08 PM.    Final

## 2019-05-17 LAB — C-REACTIVE PROTEIN: CRP: 1.1 mg/dL — ABNORMAL HIGH (ref <1.0)

## 2019-05-17 LAB — MAGNESIUM: Magnesium: 2.2 mg/dL (ref 1.7–2.4)

## 2019-05-17 LAB — CBC WITH DIFFERENTIAL/PLATELET
Abs Immature Granulocytes: 0.54 10*3/uL — ABNORMAL HIGH (ref 0.00–0.07)
Basophils Absolute: 0.1 10*3/uL (ref 0.0–0.1)
Basophils Relative: 0 %
Eosinophils Absolute: 0 10*3/uL (ref 0.0–0.5)
Eosinophils Relative: 0 %
HCT: 45.5 % (ref 39.0–52.0)
Hemoglobin: 14.9 g/dL (ref 13.0–17.0)
Immature Granulocytes: 4 %
Lymphocytes Relative: 14 %
Lymphs Abs: 2.1 10*3/uL (ref 0.7–4.0)
MCH: 27.7 pg (ref 26.0–34.0)
MCHC: 32.7 g/dL (ref 30.0–36.0)
MCV: 84.7 fL (ref 80.0–100.0)
Monocytes Absolute: 1 10*3/uL (ref 0.1–1.0)
Monocytes Relative: 6 %
Neutro Abs: 11.4 10*3/uL — ABNORMAL HIGH (ref 1.7–7.7)
Neutrophils Relative %: 76 %
Platelets: 225 10*3/uL (ref 150–400)
RBC: 5.37 MIL/uL (ref 4.22–5.81)
RDW: 16 % — ABNORMAL HIGH (ref 11.5–15.5)
WBC: 15.1 10*3/uL — ABNORMAL HIGH (ref 4.0–10.5)
nRBC: 0.5 % — ABNORMAL HIGH (ref 0.0–0.2)

## 2019-05-17 LAB — PROCALCITONIN: Procalcitonin: 0.15 ng/mL

## 2019-05-17 LAB — GLUCOSE, CAPILLARY
Glucose-Capillary: 190 mg/dL — ABNORMAL HIGH (ref 70–99)
Glucose-Capillary: 301 mg/dL — ABNORMAL HIGH (ref 70–99)
Glucose-Capillary: 368 mg/dL — ABNORMAL HIGH (ref 70–99)
Glucose-Capillary: 344 mg/dL — ABNORMAL HIGH (ref 70–99)

## 2019-05-17 LAB — COMPREHENSIVE METABOLIC PANEL
ALT: 96 U/L — ABNORMAL HIGH (ref 0–44)
AST: 50 U/L — ABNORMAL HIGH (ref 15–41)
Albumin: 2.8 g/dL — ABNORMAL LOW (ref 3.5–5.0)
Alkaline Phosphatase: 103 U/L (ref 38–126)
Anion gap: 10 (ref 5–15)
BUN: 21 mg/dL — ABNORMAL HIGH (ref 6–20)
CO2: 26 mmol/L (ref 22–32)
Calcium: 8.8 mg/dL — ABNORMAL LOW (ref 8.9–10.3)
Chloride: 103 mmol/L (ref 98–111)
Creatinine, Ser: 1.05 mg/dL (ref 0.61–1.24)
GFR calc Af Amer: >60 mL/min (ref >60)
GFR calc non Af Amer: >60 mL/min (ref >60)
Glucose, Bld: 288 mg/dL — ABNORMAL HIGH (ref 70–99)
Potassium: 4.5 mmol/L (ref 3.5–5.1)
Sodium: 139 mmol/L (ref 135–145)
Total Bilirubin: 0.8 mg/dL (ref 0.3–1.2)
Total Protein: 6.9 g/dL (ref 6.5–8.1)

## 2019-05-17 LAB — BRAIN NATRIURETIC PEPTIDE: B Natriuretic Peptide: 70.8 pg/mL (ref 0.0–100.0)

## 2019-05-17 LAB — D-DIMER, QUANTITATIVE: D-Dimer, Quant: >20 ug/mL-FEU — ABNORMAL HIGH (ref 0.00–0.50)

## 2019-05-17 MED ORDER — ENOXAPARIN SODIUM 150 MG/ML ~~LOC~~ SOLN
140.0000 mg | Freq: Two times a day (BID) | SUBCUTANEOUS | Status: DC
  Administered 2019-05-17 – 2019-05-19 (×5): 140 mg via SUBCUTANEOUS
  Filled 2019-05-17 (×6): qty 0.93

## 2019-05-17 MED ORDER — ALBUTEROL SULFATE HFA 108 (90 BASE) MCG/ACT IN AERS
2.0000 | INHALATION_SPRAY | Freq: Three times a day (TID) | RESPIRATORY_TRACT | Status: DC
  Administered 2019-05-17 – 2019-05-19 (×7): 2 via RESPIRATORY_TRACT
  Filled 2019-05-17: qty 6.7

## 2019-05-17 NOTE — Progress Notes (Signed)
SATURATION QUALIFICATIONS: (This note is used to comply with regulatory documentation for home oxygen)  Patient Saturations on Room Air at Rest = 98%  Patient Saturations on Room Air while Ambulating = 81%  Patient Saturations on 1 Liters of oxygen while Ambulating = 95%  Please briefly explain why patient needs home oxygen: Patient qualifies for a home Oxygen to help during ambulation

## 2019-05-17 NOTE — Progress Notes (Signed)
PROGRESS NOTE                                                                                                                                                                                                             Patient Demographics:    Victor Crawford, is a 44 y.o. male, DOB - Jul 03, 1975, JXB:[id number]  Outpatient Primary MD for the patient is Patient, No Pcp Per   Admit date - 05/14/2019   LOS - 3  No chief complaint on file.      Brief Narrative: Patient is a 43 y.o. male with PMHx of HTN, DM-2 morbid obesity-who was diagnosed with COVID-19 on 3/20-presented as a transfer from [facility] for worsening hypoxic respiratory failure in the setting of COVID-19 pneumonia.  Significant Events: 3/20>> diagnosed with COVID-19 at [facility] in [place] 3/24>> presented to [facility] ED with shortness of breath-hypoxia secondary to COVID-19 pneumonia 3/24>> CT angio chest at [facility] ED-negative for PE. 3/25>> transferred to [facility]  COVID-19 medications: Steroids: 3/24>> Remdesivir: 3/24>> Actemra: 3/25 x1  Antibiotics: Rocephin: 3/24>>3/26 Zithromax: 3/24>> 3/26  Microbiology data: 3/25>> MRSA PCR positive  DVT prophylaxis: SQ Lovenox-change to BID dosing 3/26  Procedures: None  Consults: None    Subjective:   Patient in bed, appears comfortable, denies any headache, no fever, no chest pain or pressure, much improved shortness of breath , no abdominal pain. No focal weakness.   Assessment  & Plan :   Acute Hypoxic Resp Failure due to Covid 19 Viral pneumonia: He had severe disease and was treated appropriately with Actemra on 05/14/2019, continue steroids and remdesivir, no antibiotics needed.  Encouraged to sit up in chair advance activity and titrate down oxygen.  Encouraged to prone at night.  Continue to use I-S and flutter valve for pulmonary toiletry.  Gradually improving oxygen requirements down  from 15 L heated high flow to 2 L nasal cannula.  O2 requirements:  SpO2: 92 % O2 Flow Rate (L/min): 2 L/min FiO2 (%): 100 %   Recent Labs  Lab 05/14/19 1101 05/15/19 0817 05/16/19 0218 05/17/19 0210  WBC 5.5 11.7* 14.3* 15.1*  HGB 14.5 14.8 14.9 14.9  HCT 44.4 45.2 45.4 45.5  PLT 178 210 185 225  MCV 83.1 84.6 84.5 84.7  MCH 27.2 27.7 27.7 27.7  MCHC 32.7 32.7 32.8 32.7  RDW 15.8* 15.9* 15.8* 16.0*  LYMPHSABS 0.6* 1.3 1.5 2.1  MONOABS 0.1 0.7 0.7 1.0  EOSABS 0.0 0.0 0.0 0.0  BASOSABS 0.0 0.0 0.0 0.1    Recent Labs  Lab 05/14/19 1101 05/15/19 0817 05/16/19 0218 05/17/19 0210  NA 138 139 140 139  K 4.3 4.5 4.5 4.5  CL 104 102 105 103  CO2 24 25 24 26   GLUCOSE 321* 323* 285* 288*  BUN 13 20 21* 21*  CREATININE 0.98 0.97 1.07 1.05  CALCIUM 8.3* 8.6* 8.9 8.8*  AST 169* 79* 67* 50*  ALT 186* 144* 121* 96*  ALKPHOS 89 93 106 103  BILITOT 0.6 0.6 0.5 0.8  ALBUMIN 2.5* 2.6* 2.7* 2.8*  MG  --   --  2.4 2.2  CRP 11.6* 6.3* 2.7* 1.1*  DDIMER  --  15.62* 13.11* >20.00*  PROCALCITON  --  0.14 0.23 0.15  HGBA1C 9.3*  --   --   --   BNP  --  50.0 136.8* 70.8    Recent Labs  Lab 05/14/19 1101 05/15/19 0817 05/16/19 0218 05/17/19 0210  CRP 11.6* 6.3* 2.7* 1.1*  DDIMER  --  15.62* 13.11* >20.00*  BNP  --  50.0 136.8* 70.8  PROCALCITON  --  0.14 0.23 0.15        Prone/Incentive Spirometry: encouraged patient to lie prone for 3-4 hours at a time for a total of 16 hours a day, and to encourage incentive spirometry use 3-4/hour  Elevated D-dimer: Secondary to COVID-19-thankfully hypoxia improving.  [facility] on 3/24 [facility]- for PE.  Negative lower extremity duplex.  However D-dimer initially came down and now trending up again, switch to full dose Lovenox on 05/17/2019.  Will monitor clinically.  Transaminitis: Secondary to COVID-19-follow closely while on remdesivir.  HTN: BP controlled without the use of any antihypertensives-follow for now.  DM-2 with  uncontrolled hyperglycemia secondary to steroids (A1c 9.3 on 3/25): Continue to hold Metformin-CBGs remain uncontrolled-increase Levemir to 20 units twice daily, change to resistant SSI-and add 4 units of NovoLog with meals.   Recent Labs    05/16/19 1659 05/16/19 2113 05/17/19 0749  GLUCAP 276* 354* 190*    Transaminitis: Secondary to COVID-19-follow closely while on remdesivir.  Probable OSA: I have recommended outpatient sleep study.  Currently on high flow oxygen-follow for now.  Obesity: BMI 51 follow with PCP for weight loss.     GI prophylaxis: PPI       Condition - Fair   Family Communication  : 05/19/19 [phone] - 05/16/2019 at 11 AM.  Full mailbox.  Code Status :  Full Code  Diet :  Diet Order            Diet Carb Modified Fluid consistency: Thin; Room service appropriate? Yes  Diet effective now               Disposition Plan  :  Remain hospitalized-Home when medically stable  Barriers to discharge: Severe hypoxia requiring O2 supplementation/complete 5 days of IV Remdesivir  Antimicorbials  :    Anti-infectives (From admission, onward)   Start     Dose/Rate Route Frequency Ordered Stop   05/15/19 1000  remdesivir 100 mg in sodium chloride 0.9 % 100 mL IVPB  Status:  Discontinued     100 mg 200 mL/hr over 30 Minutes Intravenous Daily 05/14/19 1124 05/14/19 1204   05/14/19 1400  remdesivir 100 mg in sodium chloride 0.9 % 100 mL IVPB  100 mg 200 mL/hr over 30 Minutes Intravenous Daily 05/14/19 1213 05/17/19 0933   05/14/19 1300  cefTRIAXone (ROCEPHIN) 2 g in sodium chloride 0.9 % 100 mL IVPB  Status:  Discontinued     2 g 200 mL/hr over 30 Minutes Intravenous Every 24 hours 05/14/19 1159 05/15/19 1057   05/14/19 1300  azithromycin (ZITHROMAX) tablet 500 mg  Status:  Discontinued     500 mg Oral Daily 05/14/19 1159 05/15/19 1057   05/14/19 1215  remdesivir 200 mg in sodium chloride 0.9% 250 mL IVPB  Status:  Discontinued     200 mg 580  mL/hr over 30 Minutes Intravenous Once 05/14/19 1124 05/14/19 1204      Inpatient Medications  Scheduled Meds:  albuterol  2 puff Inhalation TID   vitamin C  500 mg Oral Daily   carvedilol  3.125 mg Oral BID WC   Chlorhexidine Gluconate Cloth  6 each Topical Q0600   dexamethasone (DECADRON) injection  6 mg Intravenous Q24H   enoxaparin (LOVENOX) injection  140 mg Subcutaneous Q12H   famotidine  20 mg Oral BID   feeding supplement (GLUCERNA SHAKE)  237 mL Oral TID BM   feeding supplement (PRO-STAT SUGAR FREE 64)  30 mL Oral BID   insulin aspart  0-20 Units Subcutaneous TID WC   insulin aspart  0-5 Units Subcutaneous QHS   insulin aspart  4 Units Subcutaneous TID WC   insulin detemir  20 Units Subcutaneous BID   mupirocin ointment  1 application Nasal BID   zinc sulfate  220 mg Oral Daily   Continuous Infusions:  PRN Meds:.acetaminophen, chlorpheniramine-HYDROcodone, guaiFENesin-dextromethorphan, [DISCONTINUED] ondansetron **OR** ondansetron (ZOFRAN) IV   Time Spent in minutes  45  The patient is critically ill with multiple organ system failure and requires high complexity decision making for assessment and support, frequent evaluation and titration of therapies, advanced monitoring, review of radiographic studies and interpretation of complex data.   See all Orders from today for further details   Lala Lund M.D on 05/17/2019 at 10:47 AM  To page go to [email or web address] - use universal password  [facility] -  Office  [phone]    Objective:   Vitals:   05/17/19 0011 05/17/19 0441 05/17/19 0755 05/17/19 0900  BP: (!) 151/83 (!) 124/92 132/86   Pulse: 75 91 83 96  Resp: (!) 24 18 (!) 28 16  Temp: 98.3 F (36.8 C) 98.4 F (36.9 C) 98.9 F (37.2 C)   TempSrc: Oral Oral Oral   SpO2: 97% 100% 92%   Weight:      Height:        Wt Readings from Last 3 Encounters:  05/15/19 (!) 140.3 kg  12/08/15 122.9 kg     Intake/Output Summary (Last  24 hours) at 05/17/2019 1047 Last data filed at 05/17/2019 0013 Gross per 24 hour  Intake 480 ml  Output 1550 ml  Net -1070 ml     Physical Exam  Awake Alert, No new F.N deficits, Normal affect [place].AT,PERRAL Supple Neck,No JVD, No cervical lymphadenopathy appriciated.  Symmetrical Chest wall movement, Good air movement bilaterally, CTAB RRR,No Gallops, Rubs or new Murmurs, No Parasternal Heave +ve B.Sounds, Abd Soft, No tenderness, No organomegaly appriciated, No rebound - guarding or rigidity. No Cyanosis, Clubbing or edema, No new Rash or bruise    Data Review:    CBC Recent Labs  Lab 05/14/19 1101 05/15/19 0817 05/16/19 0218 05/17/19 0210  WBC 5.5 11.7* 14.3* 15.1*  HGB 14.5 14.8 14.9 14.9  HCT 44.4 45.2 45.4 45.5  PLT 178 210 185 225  MCV 83.1 84.6 84.5 84.7  MCH 27.2 27.7 27.7 27.7  MCHC 32.7 32.7 32.8 32.7  RDW 15.8* 15.9* 15.8* 16.0*  LYMPHSABS 0.6* 1.3 1.5 2.1  MONOABS 0.1 0.7 0.7 1.0  EOSABS 0.0 0.0 0.0 0.0  BASOSABS 0.0 0.0 0.0 0.1    Chemistries  Recent Labs  Lab 05/14/19 1101 05/15/19 0817 05/16/19 0218 05/17/19 0210  NA 138 139 140 139  K 4.3 4.5 4.5 4.5  CL 104 102 105 103  CO2 24 25 24 26   GLUCOSE 321* 323* 285* 288*  BUN 13 20 21* 21*  CREATININE 0.98 0.97 1.07 1.05  CALCIUM 8.3* 8.6* 8.9 8.8*  MG  --   --  2.4 2.2  AST 169* 79* 67* 50*  ALT 186* 144* 121* 96*  ALKPHOS 89 93 106 103  BILITOT 0.6 0.6 0.5 0.8   ------------------------------------------------------------------------------------------------------------------ No results for input(s): CHOL, HDL, LDLCALC, TRIG, CHOLHDL, LDLDIRECT in the last 72 hours.  Lab Results  Component Value Date   HGBA1C 9.3 (H) 05/14/2019   ------------------------------------------------------------------------------------------------------------------ No results for input(s): TSH, T4TOTAL, T3FREE, THYROIDAB in the last 72 hours.  Invalid input(s):  FREET3 ------------------------------------------------------------------------------------------------------------------ Recent Labs    05/15/19 0817 05/16/19 0218  FERRITIN 1,175* 952*    Coagulation profile No results for input(s): INR, PROTIME in the last 168 hours.  Recent Labs    05/16/19 0218 05/17/19 0210  DDIMER 13.11* >20.00*    Cardiac Enzymes No results for input(s): CKMB, TROPONINI, MYOGLOBIN in the last 168 hours.  Invalid input(s): CK ------------------------------------------------------------------------------------------------------------------    Component Value Date/Time   BNP 70.8 05/17/2019 0210    Micro Results Recent Results (from the past 240 hour(s))  MRSA PCR Screening     Status: Abnormal   Collection Time: 05/14/19  1:45 PM   Specimen: Nasal Mucosa; Nasopharyngeal  Result Value Ref Range Status   MRSA by PCR POSITIVE (A) NEGATIVE Final    Comment:        The GeneXpert MRSA Assay (FDA approved for NASAL specimens only), is one component of a comprehensive MRSA colonization surveillance program. It is not intended to diagnose MRSA infection nor to guide or monitor treatment for MRSA infections. RESULT CALLED TO, READ BACK BY AND VERIFIED WITH: Carmon Ginsberg. Smith RN 16:00 05/14/19 ([email or web address]) Performed at [facility], [address] [place], [place] [address]     Radiology Reports DG Chest [place] 1V same Day  Result Date: 05/15/2019 CLINICAL DATA:  Shortness of breath. EXAM: PORTABLE CHEST 1 VIEW COMPARISON:  05/13/2019. FINDINGS: Cardiomegaly. Progressive diffuse bilateral pulmonary interstitial prominence. Interstitial edema and/or pneumonitis could present this fashion. Small right pleural effusion cannot be excluded. No pneumothorax. No acute bony abnormality. IMPRESSION: Cardiomegaly. Progressive diffuse bilateral from interstitial prominence. Interstitial edema and/or pneumonitis could present this fashion. Small right pleural effusion  cannot be excluded. Electronically Signed   By: Maisie Fushomas  Register   On: 05/15/2019 07:39   VAS US LOWER EXTREMITY VENOUS (DVT)  Result Date: 05/15/2019  Lower Venous DVTStudy Indications: Swelling, and Covid+, Elevated D-dimer.  Limitations: Body habitus and poor ultrasound/tissue interface. Comparison Study: No prior exam. Performing Technologist: Kennedy BuckerKristy Siebrecht ARDMS, RVT  Examination Guidelines: A complete evaluation includes B-mode imaging, spectral Doppler, color Doppler, and power Doppler as needed of all accessible portions of each vessel. Bilateral testing is considered an integral part of a complete examination. Limited examinations for reoccurring indications may be performed as noted. The reflux  portion of the exam is performed with the patient in reverse Trendelenburg.  +---------+---------------+---------+-----------+----------+------------------+  RIGHT     Compressibility Phasicity Spontaneity Properties Thrombus Aging      +---------+---------------+---------+-----------+----------+------------------+  CFV       Full            Yes       Yes                                        +---------+---------------+---------+-----------+----------+------------------+  SFJ       Full                                                                 +---------+---------------+---------+-----------+----------+------------------+  FV Prox   Full                                                                 +---------+---------------+---------+-----------+----------+------------------+  FV Mid    Full                                             visualized with                                                                 color               +---------+---------------+---------+-----------+----------+------------------+  FV Distal Full                                             visualized with                                                                 color                +---------+---------------+---------+-----------+----------+------------------+  PFV       Full                                                                 +---------+---------------+---------+-----------+----------+------------------+  POP       Full  Yes       Yes                                        +---------+---------------+---------+-----------+----------+------------------+  PTV       Full                                             visualized with                                                                 color               +---------+---------------+---------+-----------+----------+------------------+  PERO      Partial                                          visualized with                                                                 color               +---------+---------------+---------+-----------+----------+------------------+   +---------+---------------+---------+-----------+----------+------------------+  LEFT      Compressibility Phasicity Spontaneity Properties Thrombus Aging      +---------+---------------+---------+-----------+----------+------------------+  CFV       Full            Yes       Yes                                        +---------+---------------+---------+-----------+----------+------------------+  SFJ       Full                                                                 +---------+---------------+---------+-----------+----------+------------------+  FV Prox   Full                                             visualized with                                                                 color               +---------+---------------+---------+-----------+----------+------------------+  FV Mid    Full                                             visualized with                                                                 color               +---------+---------------+---------+-----------+----------+------------------+  FV Distal Full                                              visualized with                                                                 color               +---------+---------------+---------+-----------+----------+------------------+  PFV       Full                                                                 +---------+---------------+---------+-----------+----------+------------------+  POP       Full            Yes       Yes                                        +---------+---------------+---------+-----------+----------+------------------+  PTV       Full                                             visualized with                                                                 color               +---------+---------------+---------+-----------+----------+------------------+  PERO      Full                                             visualized with  color               +---------+---------------+---------+-----------+----------+------------------+     Summary: RIGHT: - There is no evidence of deep vein thrombosis in the lower extremity. However, portions of this examination were limited- see technologist comments above.  - No cystic structure found in the popliteal fossa.  LEFT: - There is no evidence of deep vein thrombosis in the lower extremity. However, portions of this examination were limited- see technologist comments above.  - No cystic structure found in the popliteal fossa.  *See table(s) above for measurements and observations. Electronically signed by Waverly Ferrari MD on 05/15/2019 at 4:51:08 PM.    Final

## 2019-05-18 LAB — CBC WITH DIFFERENTIAL/PLATELET
Abs Immature Granulocytes: 0.7 10*3/uL — ABNORMAL HIGH (ref 0.00–0.07)
Basophils Absolute: 0.1 10*3/uL (ref 0.0–0.1)
Basophils Relative: 1 %
Eosinophils Absolute: 0.1 10*3/uL (ref 0.0–0.5)
Eosinophils Relative: 0 %
HCT: 45.9 % (ref 39.0–52.0)
Hemoglobin: 15.4 g/dL (ref 13.0–17.0)
Immature Granulocytes: 4 %
Lymphocytes Relative: 15 %
Lymphs Abs: 2.5 10*3/uL (ref 0.7–4.0)
MCH: 27.9 pg (ref 26.0–34.0)
MCHC: 33.6 g/dL (ref 30.0–36.0)
MCV: 83.2 fL (ref 80.0–100.0)
Monocytes Absolute: 1.1 10*3/uL — ABNORMAL HIGH (ref 0.1–1.0)
Monocytes Relative: 7 %
Neutro Abs: 12.4 10*3/uL — ABNORMAL HIGH (ref 1.7–7.7)
Neutrophils Relative %: 73 %
Platelets: 235 10*3/uL (ref 150–400)
RBC: 5.52 MIL/uL (ref 4.22–5.81)
RDW: 15.9 % — ABNORMAL HIGH (ref 11.5–15.5)
WBC: 16.8 10*3/uL — ABNORMAL HIGH (ref 4.0–10.5)
nRBC: 0.5 % — ABNORMAL HIGH (ref 0.0–0.2)

## 2019-05-18 LAB — COMPREHENSIVE METABOLIC PANEL
ALT: 76 U/L — ABNORMAL HIGH (ref 0–44)
AST: 38 U/L (ref 15–41)
Albumin: 2.7 g/dL — ABNORMAL LOW (ref 3.5–5.0)
Alkaline Phosphatase: 96 U/L (ref 38–126)
Anion gap: 10 (ref 5–15)
BUN: 17 mg/dL (ref 6–20)
CO2: 23 mmol/L (ref 22–32)
Calcium: 8.5 mg/dL — ABNORMAL LOW (ref 8.9–10.3)
Chloride: 106 mmol/L (ref 98–111)
Creatinine, Ser: 0.88 mg/dL (ref 0.61–1.24)
GFR calc Af Amer: >60 mL/min (ref >60)
GFR calc non Af Amer: >60 mL/min (ref >60)
Glucose, Bld: 197 mg/dL — ABNORMAL HIGH (ref 70–99)
Potassium: 4.6 mmol/L (ref 3.5–5.1)
Sodium: 139 mmol/L (ref 135–145)
Total Bilirubin: 0.5 mg/dL (ref 0.3–1.2)
Total Protein: 6.4 g/dL — ABNORMAL LOW (ref 6.5–8.1)

## 2019-05-18 LAB — GLUCOSE, CAPILLARY
Glucose-Capillary: 151 mg/dL — ABNORMAL HIGH (ref 70–99)
Glucose-Capillary: 226 mg/dL — ABNORMAL HIGH (ref 70–99)
Glucose-Capillary: 360 mg/dL — ABNORMAL HIGH (ref 70–99)
Glucose-Capillary: 366 mg/dL — ABNORMAL HIGH (ref 70–99)
Glucose-Capillary: 405 mg/dL — ABNORMAL HIGH (ref 70–99)

## 2019-05-18 LAB — C-REACTIVE PROTEIN: CRP: 0.8 mg/dL (ref <1.0)

## 2019-05-18 LAB — BRAIN NATRIURETIC PEPTIDE: B Natriuretic Peptide: 81.4 pg/mL (ref 0.0–100.0)

## 2019-05-18 LAB — D-DIMER, QUANTITATIVE: D-Dimer, Quant: 15.7 ug/mL-FEU — ABNORMAL HIGH (ref 0.00–0.50)

## 2019-05-18 LAB — PROCALCITONIN: Procalcitonin: <0.1 ng/mL

## 2019-05-18 LAB — MAGNESIUM: Magnesium: 2 mg/dL (ref 1.7–2.4)

## 2019-05-18 NOTE — TOC Initial Note (Signed)
Transition of Care [facility]) - Initial/Assessment Note    Patient Details  Name: Victor Crawford MRN: [id number] Date of Birth: March 22, 1975  Transition of [facility] CM/SW Contact:    Cherylann Parr, RN Phone Number: 05/18/2019, 11:36 AM  Clinical Narrative:   PTA independent from home. Pt informed CM that he has a PCP however he does not remember the name, pt states he will call them post discharge and request a follow up appt.  Pt will discharge home on Eliquis - CM printed both free 30 and reduced copay card to nurse station and requested that forms be given to pt prior to discharge.  CM also submitted benefit check.  Pt in agreement with home oxygen as ordered- pt chose [employer] - agency accepts referral.  CM confirmed home address                Expected Discharge Plan: Home/Self Care     Patient Goals and CMS Choice Patient states their goals for this hospitalization and ongoing recovery are:: Pt did not state a goal  for hosptialization nor ongoing care CMS Medicare.gov Compare Post Acute Care list provided to:: Patient Choice offered to / list presented to : Patient  Expected Discharge Plan and Services Expected Discharge Plan: Home/Self Care       Living arrangements for the past 2 months: Single Family Home                 DME Arranged: Oxygen DME Agency: [employer] Date DME Agency Contacted: 05/18/19 Time DME Agency Contacted: 1136 Representative spoke with at DME Agency: Zack            Prior Living Arrangements/Services Living arrangements for the past 2 months: Single Family Home   Patient language and need for interpreter reviewed:: Yes Do you feel safe going back to the place where you live?: Yes      Need for Family Participation in Patient Care: No (Comment) Care giver support system in place?: Yes (comment)   Criminal Activity/Legal Involvement Pertinent to Current Situation/Hospitalization: No - Comment as needed  Activities of Daily Living Home  Assistive Devices/Equipment: None ADL Screening (condition at time of admission) Patient's cognitive ability adequate to safely complete daily activities?: Yes Is the patient deaf or have difficulty hearing?: No Does the patient have difficulty seeing, even when wearing glasses/contacts?: No Does the patient have difficulty concentrating, remembering, or making decisions?: No Patient able to express need for assistance with ADLs?: Yes Does the patient have difficulty dressing or bathing?: No Independently performs ADLs?: Yes (appropriate for developmental age) Does the patient have difficulty walking or climbing stairs?: No Weakness of Legs: None Weakness of Arms/Hands: None  Permission Sought/Granted   Permission granted to share information with : Yes, Verbal Permission Granted              Emotional Assessment   Attitude/Demeanor/Rapport: Engaged Affect (typically observed): Accepting Orientation: : Oriented to Self, Oriented to Place, Oriented to  Time, Oriented to Situation   Psych Involvement: No (comment)  Admission diagnosis:  Pneumonia due to COVID-19 virus [U07.1, J12.82] Patient Active Problem List   Diagnosis Date Noted  . Pneumonia due to COVID-19 virus 05/14/2019  . Acute respiratory failure with hypoxia (HCC) 05/14/2019  . Elevated liver enzymes 05/14/2019  . Morbid obesity with BMI of 50.0-59.9, adult (HCC) 05/14/2019  . Hyperglycemia 05/14/2019   PCP:  Patient, No Pcp Per Pharmacy:   [id number] Rosalita Levan, [place] - [address] AT [facility] [place] [address] Phone: [phone] Fax: [phone]     Social Determinants of Health (SDOH) Interventions    Readmission Risk Interventions No flowsheet data found.

## 2019-05-18 NOTE — Progress Notes (Signed)
Occupational Therapy Treatment Patient Details Name: Victor Crawford MRN: [id number] DOB: March 11, 1975 Today's Date: 05/18/2019    History of present illness 44 y.o. male with PMHx of HTN, DM-2 morbid obesity-who was diagnosed with COVID-19 at [facility] family practice on 3/20-presented as a transfer from [facility] 05/14/19 for worsening hypoxic respiratory failure in the setting of COVID-19 pneumonia.     OT comments  Pt progressing with OT goals with decreasing O2 needs. Pt received on RA with O2 stats WFL. Pt Modified Independent to sit EOB.. Pt completed oral care tasks standing at sink on RA for > 2 minutes at Supervision level, briefly dropping to mid-high 80s, but recovering quickly. Guided pt in hallway distance mobility without AD to assess endurance levels on RA and on 1 L O2. Difficulty obtaining accurate probe readings, but good pleth noted at 86-97% on RA and 1 L O2 with pt increasing SOB with prolonged activity. However, with standing and/or seated rest break, pt recovers within 10 seconds to 90s. Will continue to follow acutely.   Follow Up Recommendations  No OT follow up;Supervision - Intermittent    Equipment Recommendations  None recommended by OT    Recommendations for Other Services      Precautions / Restrictions Precautions Precautions: Other (comment) Precaution Comments: watch O2 sats Restrictions Weight Bearing Restrictions: No       Mobility Bed Mobility Overal bed mobility: Modified Independent                Transfers Overall transfer level: Needs assistance Equipment used: None Transfers: Sit to/from Stand;Stand Pivot Transfers Sit to Stand: Supervision Stand pivot transfers: Supervision       General transfer comment: Supervision to ensure safety without AD    Balance                                           ADL either performed or assessed with clinical judgement   ADL Overall ADL's : Needs  assistance/impaired Eating/Feeding: Independent;Sitting   Grooming: Oral care;Set up;Supervision/safety;Standing Grooming Details (indicate cue type and reason): Supervision for monitoring of O2/exertion levels while standing at sink                             Functional mobility during ADLs: Supervision/safety       Vision       Perception     Praxis      Cognition Arousal/Alertness: Awake/alert Behavior During Therapy: WFL for tasks assessed/performed Overall Cognitive Status: Within Functional Limits for tasks assessed                                          Exercises Exercises: Other exercises Other Exercises Other Exercises: pursed lip breathing    Shoulder Instructions       General Comments Pt received on RA mid 90s at rest. Desats to 86-87% consistently with good pleth on RA and 1 L O2. Difficulty with probe reading at times, as pt's exertion level did not match O2 readings (70s). Pt did endorse some SOB after mobility, recovers to 90s within 10 seconds of rest     Pertinent Vitals/ Pain       Pain Assessment: No/denies pain  Home Living  Prior Functioning/Environment              Frequency  Min 2X/week        Progress Toward Goals  OT Goals(current goals can now be found in the care plan section)  Progress towards OT goals: Progressing toward goals  Acute Rehab OT Goals Patient Stated Goal: go home OT Goal Formulation: With patient Time For Goal Achievement: 05/30/19 Potential to Achieve Goals: Good ADL Goals Pt Will Perform Grooming: with modified independence;standing Pt Will Perform Lower Body Dressing: with modified independence;sit to/from stand Pt/caregiver will Perform Home Exercise Program: Increased strength;Both right and left upper extremity;With written HEP provided;Independently Additional ADL Goal #1: Pt will independently verbalize at  least 3 energy conservation strategies to utilize during functional/ADL tasks.  Plan Discharge plan remains appropriate    Co-evaluation                 AM-PAC OT "6 Clicks" Daily Activity     Outcome Measure   Help from another person eating meals?: None Help from another person taking care of personal grooming?: None Help from another person toileting, which includes using toliet, bedpan, or urinal?: None Help from another person bathing (including washing, rinsing, drying)?: None Help from another person to put on and taking off regular upper body clothing?: None Help from another person to put on and taking off regular lower body clothing?: None 6 Click Score: 24    End of Session Equipment Utilized During Treatment: Oxygen  OT Visit Diagnosis: Other (comment)   Activity Tolerance Patient tolerated treatment well   Patient Left in bed;with call bell/phone within reach   Nurse Communication Other (comment);Mobility status(O2 stats)        Time: 0350-0938 OT Time Calculation (min): 17 min  Charges: OT General Charges $OT Visit: 1 Visit OT Treatments $Therapeutic Activity: 8-22 mins  Layla Maw, OTR/L   Layla Maw 05/18/2019, 3:45 PM

## 2019-05-18 NOTE — TOC Benefit Eligibility Note (Signed)
Transition of Care [facility]) Benefit Eligibility Note    Patient Details  Name: Taimur Fier MRN: [id number] Date of Birth: [birth year]   Medication/Dose: ELIQUIS   2.5 MG BID , CO-PAY-  $175.00   and     ELIQUIS   5 MG, CO-PAY- $175.00  Covered?: Yes  Tier: 2 Drug  Prescription Coverage Preferred Pharmacy: [facility]  and   [facility]  Spoke with Person/Company/Phone Number:: ANN     @ [employer] HW # [phone]  Co-Pay: $175.00   FOR EACH PRESCRIPTION  Prior Approval: No  Deductible: (NO DEDUCTIBLE WITH PLAN  and    OUT OF POCKET : NOT MET)  Additional Notes: APIXABAN : NON-FORMULARY   and  ELIQUIS  10 MG: NON-FORMULARY    Memory Argue Phone Number: 05/18/2019, 12:04 PM

## 2019-05-18 NOTE — Progress Notes (Addendum)
Inpatient Diabetes Program Recommendations  AACE/ADA: New Consensus Statement on Inpatient Glycemic Control (2015)  Target Ranges:  Prepandial:   less than 140 mg/dL      Peak postprandial:   less than 180 mg/dL (1-2 hours)      Critically ill patients:  140 - 180 mg/dL   Lab Results  Component Value Date   GLUCAP 226 (H) 05/18/2019   HGBA1C 9.3 (H) 05/14/2019    Review of Glycemic Control Results for GARABEDIAN, Brion (MRN [id number]) as of 05/18/2019 11:38  Ref. Range 05/17/2019 12:20 05/17/2019 17:20 05/17/2019 21:46 05/18/2019 07:43 05/18/2019 11:29  Glucose-Capillary Latest Ref Range: 70 - 99 mg/dL 166 (H) 063 (H) 016 (H) 151 (H) 226 (H)   Current Orders: Levemir 20 units BID                            Novolog Resistant Correction Scale/ SSI (0-20 units) TID AC + HS                            Novolog 4 units TID  Inpatient Diabetes Program Recommendations:    Noted postprandial CBGs elevated. Steroids last dose today. -Increase Novolog meal coverage to 8 units tid if eats 50%  Thank you, [name]. Elenor Wildes, RN, MSN, CDE  Diabetes Coordinator Inpatient Glycemic Control Team Team Pager [phone] (8am-5pm) 05/18/2019 11:40 AM

## 2019-05-18 NOTE — Progress Notes (Signed)
PROGRESS NOTE                                                                                                                                                                                                             Patient Demographics:    Victor Crawford, is a 44 y.o. male, DOB - 08-16-1975, JYN:[id number]  Outpatient Primary MD for the patient is Patient, No Pcp Per   Admit date - 05/14/2019   LOS - 4  No chief complaint on file.      Brief Narrative: Patient is a 44 y.o. male with PMHx of HTN, DM-2 morbid obesity-who was diagnosed with COVID-19 on 3/20-presented as a transfer from [facility] health for worsening hypoxic respiratory failure in the setting of COVID-19 pneumonia.  Significant Events: 3/20>> diagnosed with COVID-19 at [facility] family practice in [place] 3/24>> presented to [facility] ED with shortness of breath-hypoxia secondary to COVID-19 pneumonia 3/24>> CT angio chest at [facility] ED-negative for PE. 3/25>> transferred to [facility]  COVID-19 medications: Steroids: 3/24>> Remdesivir: 3/24>> Actemra: 3/25 x1  Antibiotics: Rocephin: 3/24>>3/26 Zithromax: 3/24>> 3/26  Microbiology data: 3/25>> MRSA PCR positive  DVT prophylaxis: SQ Lovenox-change to BID dosing 3/26  Procedures: None  Consults: None    Subjective:   Patient in bed, appears comfortable, denies any headache, no fever, no chest pain or pressure, no shortness of breath , no abdominal pain. No focal weakness.   Assessment  & Plan :   Acute Hypoxic Resp Failure due to Covid 19 Viral pneumonia: He had severe disease and was treated appropriately with Actemra on 05/14/2019, continue steroids and remdesivir, no antibiotics needed.  Encouraged to sit up in chair advance activity and titrate down oxygen.  Encouraged to prone at night.  Continue to use I-S and flutter valve for pulmonary toiletry.  Gradually improving oxygen requirements down from 15 L  heated high flow to room air at rest, upon ambulation still requiring some oxygen but that could be due to underlying OSH and morbid obesity, will monitor and ambulate again later today on room air.  O2 requirements:  SpO2: 93 % O2 Flow Rate (L/min): 2 L/min FiO2 (%): 100 %   Recent Labs  Lab 05/14/19 1101 05/15/19 0817 05/16/19 0218 05/17/19 0210 05/18/19 0434  WBC 5.5 11.7* 14.3* 15.1* 16.8*  HGB 14.5 14.8 14.9 14.9 15.4  HCT 44.4 45.2 45.4 45.5 45.9  PLT 178 210 185 225 235  MCV 83.1 84.6 84.5 84.7 83.2  MCH 27.2 27.7 27.7 27.7 27.9  MCHC 32.7 32.7 32.8 32.7 33.6  RDW 15.8* 15.9* 15.8* 16.0* 15.9*  LYMPHSABS 0.6* 1.3 1.5 2.1 2.5  MONOABS 0.1 0.7 0.7 1.0 1.1*  EOSABS 0.0 0.0 0.0 0.0 0.1  BASOSABS 0.0 0.0 0.0 0.1 0.1    Recent Labs  Lab 05/14/19 1101 05/15/19 0817 05/16/19 0218 05/17/19 0210 05/18/19 0434  NA 138 139 140 139 139  K 4.3 4.5 4.5 4.5 4.6  CL 104 102 105 103 106  CO2 24 25 24 26 23   GLUCOSE 321* 323* 285* 288* 197*  BUN 13 20 21* 21* 17  CREATININE 0.98 0.97 1.07 1.05 0.88  CALCIUM 8.3* 8.6* 8.9 8.8* 8.5*  AST 169* 79* 67* 50* 38  ALT 186* 144* 121* 96* 76*  ALKPHOS 89 93 106 103 96  BILITOT 0.6 0.6 0.5 0.8 0.5  ALBUMIN 2.5* 2.6* 2.7* 2.8* 2.7*  MG  --   --  2.4 2.2 2.0  CRP 11.6* 6.3* 2.7* 1.1*  --   DDIMER  --  15.62* 13.11* >20.00* 15.70*  PROCALCITON  --  0.14 0.23 0.15  --   HGBA1C 9.3*  --   --   --   --   BNP  --  50.0 136.8* 70.8  --     Recent Labs  Lab 05/14/19 1101 05/15/19 0817 05/16/19 0218 05/17/19 0210 05/18/19 0434  CRP 11.6* 6.3* 2.7* 1.1*  --   DDIMER  --  15.62* 13.11* >20.00* 15.70*  BNP  --  50.0 136.8* 70.8  --   PROCALCITON  --  0.14 0.23 0.15  --         Prone/Incentive Spirometry: encouraged patient to lie prone for 3-4 hours at a time for a total of 16 hours a day, and to encourage incentive spirometry use 3-4/hour  Elevated D-dimer: Secondary to COVID-19-thankfully hypoxia improving.  [facility]  Negative lower extremity duplex.  However D-dimer initially came down and now trending up again on 2019-05-17, he was switched to full dose Lovenox and since then D-dimer is downtrending, continue full dose Lovenox and monitor D-dimer closely. Very high risk for developing a clot post discharge hence will place him on at least 2 weeks of Eliquis.  Transaminitis: Secondary to COVID-19-follow closely while on remdesivir.  HTN: BP controlled without the use of any antihypertensives-follow for now.  DM-2 with uncontrolled hyperglycemia secondary to steroids (A1c 9.3 on 3/25): Continue to hold Metformin-CBGs remain uncontrolled-increase Levemir to 20 units twice daily, change to resistant SSI-and add 4 units of NovoLog with meals.   Recent Labs    05/17/19 1720 05/17/19 2146 05/18/19 0743  GLUCAP 368* 344* 151*    Transaminitis: Secondary to COVID-19-follow closely while on remdesivir.  Probable OSA: I have recommended outpatient sleep study.  Currently on high flow oxygen-follow for now.  Obesity: BMI 51 follow with PCP for weight loss.     GI prophylaxis: PPI  Condition - Fair   Family Communication  : Loraine MapleCalled Melissa [phone] - 05/16/2019 at 11 AM.  Full mailbox. Updated wife over the phone on 2019-05-18 when she called patient's room, her cell phone is not working according to her.  Code Status :  Full Code  Diet :  Diet Order            Diet Carb  Modified Fluid consistency: Thin; Room service appropriate? Yes  Diet effective now               Disposition Plan  :  Remain hospitalized-Home when medically stable  Barriers to discharge: Severe hypoxia requiring O2 supplementation/complete 5 days of IV Remdesivir, rapidly rising D-dimer requiring full dose Lovenox.  Antimicorbials  :    Anti-infectives (From admission, onward)   Start     Dose/Rate Route Frequency Ordered Stop   05/15/19 1000  remdesivir 100 mg in sodium chloride 0.9 % 100 mL IVPB   Status:  Discontinued     100 mg 200 mL/hr over 30 Minutes Intravenous Daily 05/14/19 1124 05/14/19 1204   05/14/19 1400  remdesivir 100 mg in sodium chloride 0.9 % 100 mL IVPB     100 mg 200 mL/hr over 30 Minutes Intravenous Daily 05/14/19 1213 05/17/19 0933   05/14/19 1300  cefTRIAXone (ROCEPHIN) 2 g in sodium chloride 0.9 % 100 mL IVPB  Status:  Discontinued     2 g 200 mL/hr over 30 Minutes Intravenous Every 24 hours 05/14/19 1159 05/15/19 1057   05/14/19 1300  azithromycin (ZITHROMAX) tablet 500 mg  Status:  Discontinued     500 mg Oral Daily 05/14/19 1159 05/15/19 1057   05/14/19 1215  remdesivir 200 mg in sodium chloride 0.9% 250 mL IVPB  Status:  Discontinued     200 mg 580 mL/hr over 30 Minutes Intravenous Once 05/14/19 1124 05/14/19 1204      Inpatient Medications  Scheduled Meds: . albuterol  2 puff Inhalation TID  . vitamin C  500 mg Oral Daily  . carvedilol  3.125 mg Oral BID WC  . Chlorhexidine Gluconate Cloth  6 each Topical Q0600  . dexamethasone (DECADRON) injection  6 mg Intravenous Q24H  . enoxaparin (LOVENOX) injection  140 mg Subcutaneous Q12H  . famotidine  20 mg Oral BID  . feeding supplement (GLUCERNA SHAKE)  237 mL Oral TID BM  . feeding supplement (PRO-STAT SUGAR FREE 64)  30 mL Oral BID  . insulin aspart  0-20 Units Subcutaneous TID WC  . insulin aspart  0-5 Units Subcutaneous QHS  . insulin aspart  4 Units Subcutaneous TID WC  . insulin detemir  20 Units Subcutaneous BID  . mupirocin ointment  1 application Nasal BID  . zinc sulfate  220 mg Oral Daily   Continuous Infusions:  PRN Meds:.acetaminophen, chlorpheniramine-HYDROcodone, guaiFENesin-dextromethorphan, [DISCONTINUED] ondansetron **OR** ondansetron (ZOFRAN) IV   Time Spent in minutes  45  The patient is critically ill with multiple organ system failure and requires high complexity decision making for assessment and support, frequent evaluation and titration of therapies, advanced monitoring,  review of radiographic studies and interpretation of complex data.   See all Orders from today for further details   Susa Raring M.D on 05/18/2019 at 9:36 AM  To page go to [email or web address] - use universal password  [facility] -  Office  [phone]    Objective:   Vitals:   05/18/19 0448 05/18/19 0748 05/18/19 0828 05/18/19 0833  BP: (!) 144/98 133/88    Pulse: 73 63 86 83  Resp: (!) Temp: 98.1 F (36.7 C) 97.9 F (36.6 C)    TempSrc: Oral Oral    SpO2: 90% (!) 89% 95% 93%  Weight:      Height:        Wt Readings from Last 3 Encounters:  05/15/19 (!) 140.3 kg  12/08/15 122.9 kg  Intake/Output Summary (Last 24 hours) at 05/18/2019 0936 Last data filed at 05/18/2019 0833 Gross per 24 hour  Intake -  Output 1050 ml  Net -1050 ml     Physical Exam  Awake Alert, No new F.N deficits, Normal affect [place].AT,PERRAL Supple Neck,No JVD, No cervical lymphadenopathy appriciated.  Symmetrical Chest wall movement, Good air movement bilaterally, CTAB RRR,No Gallops, Rubs or new Murmurs, No Parasternal Heave +ve B.Sounds, Abd Soft, No tenderness, No organomegaly appriciated, No rebound - guarding or rigidity. No Cyanosis, Clubbing or edema, No new Rash or bruise    Data Review:    CBC Recent Labs  Lab 05/14/19 1101 05/15/19 0817 05/16/19 0218 05/17/19 0210 05/18/19 0434  WBC 5.5 11.7* 14.3* 15.1* 16.8*  HGB 14.5 14.8 14.9 14.9 15.4  HCT 44.4 45.2 45.4 45.5 45.9  PLT 178 210 185 225 235  MCV 83.1 84.6 84.5 84.7 83.2  MCH 27.2 27.7 27.7 27.7 27.9  MCHC 32.7 32.7 32.8 32.7 33.6  RDW 15.8* 15.9* 15.8* 16.0* 15.9*  LYMPHSABS 0.6* 1.3 1.5 2.1 2.5  MONOABS 0.1 0.7 0.7 1.0 1.1*  EOSABS 0.0 0.0 0.0 0.0 0.1  BASOSABS 0.0 0.0 0.0 0.1 0.1    Chemistries  Recent Labs  Lab 05/14/19 1101 05/15/19 0817 05/16/19 0218 05/17/19 0210 05/18/19 0434  NA 138 139 140 139 139  K 4.3 4.5 4.5 4.5 4.6  CL 104 102 105 103 106  CO2 24 25 24 26 23    GLUCOSE 321* 323* 285* 288* 197*  BUN 13 20 21* 21* 17  CREATININE 0.98 0.97 1.07 1.05 0.88  CALCIUM 8.3* 8.6* 8.9 8.8* 8.5*  MG  --   --  2.4 2.2 2.0  AST 169* 79* 67* 50* 38  ALT 186* 144* 121* 96* 76*  ALKPHOS 89 93 106 103 96  BILITOT 0.6 0.6 0.5 0.8 0.5   ------------------------------------------------------------------------------------------------------------------ No results for input(s): CHOL, HDL, LDLCALC, TRIG, CHOLHDL, LDLDIRECT in the last 72 hours.  Lab Results  Component Value Date   HGBA1C 9.3 (H) 05/14/2019   ------------------------------------------------------------------------------------------------------------------ No results for input(s): TSH, T4TOTAL, T3FREE, THYROIDAB in the last 72 hours.  Invalid input(s): FREET3 ------------------------------------------------------------------------------------------------------------------ Recent Labs    05/16/19 0218  FERRITIN 952*    Coagulation profile No results for input(s): INR, PROTIME in the last 168 hours.  Recent Labs    05/17/19 0210 05/18/19 0434  DDIMER >20.00* 15.70*    Cardiac Enzymes No results for input(s): CKMB, TROPONINI, MYOGLOBIN in the last 168 hours.  Invalid input(s): CK ------------------------------------------------------------------------------------------------------------------    Component Value Date/Time   BNP 70.8 05/17/2019 0210    Micro Results Recent Results (from the past 240 hour(s))  MRSA PCR Screening     Status: Abnormal   Collection Time: 05/14/19  1:45 PM   Specimen: Nasal Mucosa; Nasopharyngeal  Result Value Ref Range Status   MRSA by PCR POSITIVE (A) NEGATIVE Final    Comment:        The GeneXpert MRSA Assay (FDA approved for NASAL specimens only), is one component of a comprehensive MRSA colonization surveillance program. It is not intended to diagnose MRSA infection nor to guide or monitor treatment for MRSA infections. RESULT CALLED TO,  READ BACK BY AND VERIFIED WITH: 05/16/19 RN 16:00 05/14/19 ([email or web address]) Performed at [facility], [address] [place], [place]     Radiology Reports DG Chest [place] 1V same Day  Result Date: 05/15/2019 CLINICAL DATA:  Shortness of breath. EXAM: PORTABLE CHEST 1 VIEW COMPARISON:  05/13/2019. FINDINGS: Cardiomegaly. Progressive diffuse bilateral pulmonary interstitial prominence. Interstitial edema and/or pneumonitis could present this fashion. Small right pleural effusion cannot be excluded. No pneumothorax. No acute bony abnormality. IMPRESSION: Cardiomegaly. Progressive diffuse bilateral from interstitial prominence. Interstitial edema and/or pneumonitis could present this fashion. Small right pleural effusion cannot be excluded. Electronically Signed   By: Marcello Moores  Register   On: 05/15/2019 07:39   VAS [place] LOWER EXTREMITY VENOUS (DVT)  Result Date: 05/15/2019  Lower Venous DVTStudy Indications: Swelling, and Covid+, Elevated D-dimer.  Limitations: Body habitus and poor ultrasound/tissue interface. Comparison Study: No prior exam. Performing Technologist: Baldwin Crown ARDMS, RVT  Examination Guidelines: A complete evaluation includes B-mode imaging, spectral Doppler, color Doppler, and power Doppler as needed of all accessible portions of each vessel. Bilateral testing is considered an integral part of a complete examination. Limited examinations for reoccurring indications may be performed as noted. The reflux portion of the exam is performed with the patient in reverse Trendelenburg.  +---------+---------------+---------+-----------+----------+------------------+ RIGHT    CompressibilityPhasicitySpontaneityPropertiesThrombus Aging     +---------+---------------+---------+-----------+----------+------------------+ CFV      Full           Yes      Yes                                     +---------+---------------+---------+-----------+----------+------------------+ SFJ       Full                                                            +---------+---------------+---------+-----------+----------+------------------+ FV Prox  Full                                                            +---------+---------------+---------+-----------+----------+------------------+ FV Mid   Full                                         visualized with                                                          color              +---------+---------------+---------+-----------+----------+------------------+ FV DistalFull                                         visualized with                                                          color              +---------+---------------+---------+-----------+----------+------------------+  PFV      Full                                                            +---------+---------------+---------+-----------+----------+------------------+ POP      Full           Yes      Yes                                     +---------+---------------+---------+-----------+----------+------------------+ PTV      Full                                         visualized with                                                          color              +---------+---------------+---------+-----------+----------+------------------+ PERO     Partial                                      visualized with                                                          color              +---------+---------------+---------+-----------+----------+------------------+   +---------+---------------+---------+-----------+----------+------------------+ LEFT     CompressibilityPhasicitySpontaneityPropertiesThrombus Aging     +---------+---------------+---------+-----------+----------+------------------+ CFV      Full           Yes      Yes                                      +---------+---------------+---------+-----------+----------+------------------+ SFJ      Full                                                            +---------+---------------+---------+-----------+----------+------------------+ FV Prox  Full                                         visualized with  color              +---------+---------------+---------+-----------+----------+------------------+ FV Mid   Full                                         visualized with                                                          color              +---------+---------------+---------+-----------+----------+------------------+ FV DistalFull                                         visualized with                                                          color              +---------+---------------+---------+-----------+----------+------------------+ PFV      Full                                                            +---------+---------------+---------+-----------+----------+------------------+ POP      Full           Yes      Yes                                     +---------+---------------+---------+-----------+----------+------------------+ PTV      Full                                         visualized with                                                          color              +---------+---------------+---------+-----------+----------+------------------+ PERO     Full                                         visualized with  color              +---------+---------------+---------+-----------+----------+------------------+     Summary: RIGHT: - There is no evidence of deep vein thrombosis in the lower extremity. However, portions of this examination were limited- see technologist comments above.  - No cystic  structure found in the popliteal fossa.  LEFT: - There is no evidence of deep vein thrombosis in the lower extremity. However, portions of this examination were limited- see technologist comments above.  - No cystic structure found in the popliteal fossa.  *See table(s) above for measurements and observations. Electronically signed by Waverly Ferrari MD on 05/15/2019 at 4:51:08 PM.    Final

## 2019-05-19 LAB — COMPREHENSIVE METABOLIC PANEL
ALT: 80 U/L — ABNORMAL HIGH (ref 0–44)
AST: 43 U/L — ABNORMAL HIGH (ref 15–41)
Albumin: 2.8 g/dL — ABNORMAL LOW (ref 3.5–5.0)
Alkaline Phosphatase: 96 U/L (ref 38–126)
Anion gap: 9 (ref 5–15)
BUN: 17 mg/dL (ref 6–20)
CO2: 23 mmol/L (ref 22–32)
Calcium: 8.8 mg/dL — ABNORMAL LOW (ref 8.9–10.3)
Chloride: 105 mmol/L (ref 98–111)
Creatinine, Ser: 0.91 mg/dL (ref 0.61–1.24)
GFR calc Af Amer: >60 mL/min (ref >60)
GFR calc non Af Amer: >60 mL/min (ref >60)
Glucose, Bld: 226 mg/dL — ABNORMAL HIGH (ref 70–99)
Potassium: 4.3 mmol/L (ref 3.5–5.1)
Sodium: 137 mmol/L (ref 135–145)
Total Bilirubin: 0.5 mg/dL (ref 0.3–1.2)
Total Protein: 6.7 g/dL (ref 6.5–8.1)

## 2019-05-19 LAB — CBC WITH DIFFERENTIAL/PLATELET
Abs Immature Granulocytes: 0.99 10*3/uL — ABNORMAL HIGH (ref 0.00–0.07)
Basophils Absolute: 0.1 10*3/uL (ref 0.0–0.1)
Basophils Relative: 1 %
Eosinophils Absolute: 0 10*3/uL (ref 0.0–0.5)
Eosinophils Relative: 0 %
HCT: 46.6 % (ref 39.0–52.0)
Hemoglobin: 15.2 g/dL (ref 13.0–17.0)
Immature Granulocytes: 6 %
Lymphocytes Relative: 12 %
Lymphs Abs: 2.1 10*3/uL (ref 0.7–4.0)
MCH: 27.2 pg (ref 26.0–34.0)
MCHC: 32.6 g/dL (ref 30.0–36.0)
MCV: 83.4 fL (ref 80.0–100.0)
Monocytes Absolute: 1.3 10*3/uL — ABNORMAL HIGH (ref 0.1–1.0)
Monocytes Relative: 7 %
Neutro Abs: 13.3 10*3/uL — ABNORMAL HIGH (ref 1.7–7.7)
Neutrophils Relative %: 74 %
Platelets: 289 10*3/uL (ref 150–400)
RBC: 5.59 MIL/uL (ref 4.22–5.81)
RDW: 16 % — ABNORMAL HIGH (ref 11.5–15.5)
WBC: 17.7 10*3/uL — ABNORMAL HIGH (ref 4.0–10.5)
nRBC: 0.4 % — ABNORMAL HIGH (ref 0.0–0.2)

## 2019-05-19 LAB — GLUCOSE, CAPILLARY: Glucose-Capillary: 151 mg/dL — ABNORMAL HIGH (ref 70–99)

## 2019-05-19 LAB — BRAIN NATRIURETIC PEPTIDE: B Natriuretic Peptide: 43.2 pg/mL (ref 0.0–100.0)

## 2019-05-19 LAB — C-REACTIVE PROTEIN: CRP: 0.6 mg/dL (ref <1.0)

## 2019-05-19 LAB — MAGNESIUM: Magnesium: 2 mg/dL (ref 1.7–2.4)

## 2019-05-19 LAB — D-DIMER, QUANTITATIVE: D-Dimer, Quant: 13.05 ug/mL-FEU — ABNORMAL HIGH (ref 0.00–0.50)

## 2019-05-19 MED ORDER — INSULIN ASPART 100 UNIT/ML ~~LOC~~ SOLN: SUBCUTANEOUS | 0 refills | Status: AC

## 2019-05-19 MED ORDER — "INSULIN SYRINGE-NEEDLE U-100 25G X 1"" 1 ML MISC": 0 refills | Status: DC

## 2019-05-19 MED ORDER — INSULIN ASPART 100 UNIT/ML ~~LOC~~ SOLN: SUBCUTANEOUS | 0 refills | Status: DC

## 2019-05-19 MED ORDER — INSULIN STARTER KIT- PEN NEEDLES (ENGLISH)
1.0000 | Freq: Once | Status: AC
  Administered 2019-05-19: 1
  Filled 2019-05-19: qty 1

## 2019-05-19 MED ORDER — "INSULIN SYRINGE-NEEDLE U-100 25G X 1"" 1 ML MISC": 0 refills | Status: AC

## 2019-05-19 MED ORDER — FREESTYLE SYSTEM KIT: 1.0000 | PACK | Freq: Three times a day (TID) | 1 refills | Status: AC

## 2019-05-19 MED ORDER — INSULIN GLARGINE 100 UNIT/ML ~~LOC~~ SOLN: 30.0000 [IU] | Freq: Every day | SUBCUTANEOUS | 0 refills | Status: AC

## 2019-05-19 MED ORDER — APIXABAN 2.5 MG PO TABS: 2.5000 mg | ORAL_TABLET | Freq: Two times a day (BID) | ORAL | 0 refills | Status: AC

## 2019-05-19 MED ORDER — FREESTYLE SYSTEM KIT: 1.0000 | PACK | Freq: Three times a day (TID) | 1 refills | Status: DC

## 2019-05-19 MED ORDER — INSULIN GLARGINE 100 UNIT/ML ~~LOC~~ SOLN: 30.0000 [IU] | Freq: Every day | SUBCUTANEOUS | 0 refills | Status: DC

## 2019-05-19 MED FILL — ELIQUIS 2.5 MG TABLET: 2.5 | 15 days supply | Qty: 30 | Fill #0

## 2019-05-19 NOTE — Progress Notes (Addendum)
Inpatient Diabetes Program Recommendations  AACE/ADA: New Consensus Statement on Inpatient Glycemic Control (2015)  Target Ranges:  Prepandial:   less than 140 mg/dL      Peak postprandial:   less than 180 mg/dL (1-2 hours)      Critically ill patients:  140 - 180 mg/dL   Lab Results  Component Value Date   GLUCAP 151 (H) 05/19/2019   HGBA1C 9.3 (H) 05/14/2019    Review of Glycemic Control  Inpatient Diabetes Program Recommendations:   Spoke with patient via phone and reviewed/answered questions regarding new onset diabetes and insulin. Reviewed long acting and short acting insulin. Patient states he felt comfortable giving himself his own injections while in the hospital. Patient agrees to review the book ordered for him Living Well With Diabetes. Patient states he is interested in attending outpatient diabetes in [place] after recovering time. Ordered insulin pen starter kit for patient. Spoke with RN covering for RN Staci Righter and nurses plan to review insulin pen with patient. Reviewed basic plate method nutrition with patient and patient states understanding to switch to sugar free drinks and limit foods high in carbohydrates and sugars. Patient's girlfriend Ms Aleen Sells stated comfort in assisting patient with injections.  Thank you, Nani Gasser. Zulma Court, RN, MSN, CDE  Diabetes Coordinator Inpatient Glycemic Control Team Team Pager [phone] (8am-5pm) 05/19/2019 10:03 AM

## 2019-05-19 NOTE — Care Management (Addendum)
Pt deemed stable for discharge home today.  CM requested bedside nurse to ensure pt has Eliquis cards printed to the unit on yesterday. Per attending; pt will only need Eliquis for 2 weeks. [employer] will deliver POC tank for oxygen to unit before discharge and therefore pt doesn't need to wait until home oxygen equipment is delivered before leaving [facility].  [employer] will follow up with pt post discharge to arrange home oxygen equipment delivery.    Discharge order written -no outstanding TOC needs determined.  CM signing off

## 2019-05-19 NOTE — Progress Notes (Signed)
Physical Therapy Treatment Patient Details Name: Victor Crawford MRN: [id number] DOB: [birth year] Today's Date: 05/19/2019    History of Present Illness 44 year old male with PMHx of HTN, DM-2 morbid obesity-who was diagnosed with COVID-19 at [facility] family practice on 3/20-presented as a transfer from [place] health 05/14/19 for worsening hypoxic respiratory failure in the setting of COVID-19 pneumonia. Patient on Lovenox for DVT prophylaxis. Outpatient sleep study recommended.    PT Comments    Patient independent with mobility without assistive device. It appears he requires supplemental oxygen for extended mobility or longer tasks.  O2 sat on room air at rest: 90%, HR 105 bpm O2 sat post ambulation in room on room air: 88% O2 sat post ambulation in hallway on 1L: 87%, HR 107 bpm, recovers within one minute seated rest to >/=90%  Education and demonstration on safety with O2 tube management during mobility. Patient declined practicing. No PT needs identified at time of discharge.   Follow Up Recommendations  No PT follow up;Supervision - Intermittent     Equipment Recommendations  None recommended by PT       Precautions / Restrictions Precautions Precautions: Other (comment) Precaution Comments: monitor oxygen saturation Restrictions Weight Bearing Restrictions: No    Mobility  Bed Mobility  General bed mobility comments: Patient already OOB in recliner.  Transfers Overall transfer level: Independent Equipment used: None Transfers: Sit to/from Stand Sit to Stand: Independent         General transfer comment: sit<>stand x 2 trials  Ambulation/Gait Ambulation/Gait assistance: Modified independent (Device/Increase time);Independent Gait Distance (Feet): 40 Feet(100) Assistive device: None Gait Pattern/deviations: Step-through pattern;WFL(Within Functional Limits) Gait velocity: mildly decreased   General Gait Details: Room air for trial 1 in room 61ft. 1L [place] for  trial 2 for ambulation in hallway approx 132ft.    Stairs Stairs: (Patient confirms no stairs.)            Balance Overall balance assessment: No apparent balance deficits (not formally assessed)    Cognition Arousal/Alertness: Awake/alert Behavior During Therapy: WFL for tasks assessed/performed Overall Cognitive Status: Within Functional Limits for tasks assessed         General Comments General comments (skin integrity, edema, etc.): At rest on room air, oxygen saturation 90%, HR 105 bpm. With ambulation in room on room air, oxygen saturation down to 88%. Ambulation in hallway on 1L , oxygen saturation 87% post, HR 107 bpm. Recovered within approx 1 minute seated rest to >/=90%. Left on room air at end of session as he was on room air at start of session.      Pertinent Vitals/Pain Pain Assessment: No/denies pain(No complaints of pain. No signs/symptoms of pain.)           PT Goals (current goals can now be found in the care plan section) Progress towards PT goals: Progressing toward goals    Frequency    Min 3X/week      PT Plan Current plan remains appropriate       AM-PAC PT "6 Clicks" Mobility   Outcome Measure  Help needed turning from your back to your side while in a flat bed without using bedrails?: None Help needed moving from lying on your back to sitting on the side of a flat bed without using bedrails?: None Help needed moving to and from a bed to a chair (including a wheelchair)?: None Help needed standing up from a chair using your arms (e.g., wheelchair or bedside chair)?: None Help needed to walk  in hospital room?: None Help needed climbing 3-5 steps with a railing? : A Little 6 Click Score: 23    End of Session Equipment Utilized During Treatment: Oxygen Activity Tolerance: Patient tolerated treatment well Patient left: in chair;with call bell/phone within reach   PT Visit Diagnosis: Muscle weakness (generalized) (M62.81);Other  abnormalities of gait and mobility (R26.89)     Time: 7672-0947 PT Time Calculation (min) (ACUTE ONLY): 19 min  Charges:  $Gait Training: 8-22 mins                     Angelene Giovanni, PT, DPT Acute Rehab [phone] office     Angelene Giovanni 05/19/2019, 9:45 AM

## 2019-05-19 NOTE — Plan of Care (Signed)
  Problem: Education: Goal: Knowledge of General Education information will improve Description: Including pain rating scale, medication(s)/side effects and non-pharmacologic comfort measures Outcome: Completed/Met

## 2019-05-19 NOTE — Discharge Summary (Signed)
Victor Crawford EHM:[id number] DOB: [birth year] DOA: 05/14/2019  PCP: Patient, No Pcp Per  Admit date: 05/14/2019  Discharge date: 05/19/2019  Admitted From: Home   Disposition:  Home   Recommendations for Outpatient Follow-up:   Follow up with PCP in 1-2 weeks  PCP Please obtain BMP/CBC, 2 view CXR in 1week,  (see Discharge instructions)   PCP Please follow up on the following pending results: Monitor CBGs, oxygen requirement, D-dimer, CBC, CMP a two-view chest x-ray in 1 to 2 weeks post discharge.   Home Health: None Equipment/Devices: 2 L nasal cannula oxygen at night and in the daytime as needed Consultations: None  Discharge Condition: Stable    CODE STATUS: Full    Diet Recommendation: Heart Healthy Low Carb  Diet Order            Diet Carb Modified Fluid consistency: Thin; Room service appropriate? Yes  Diet effective now               CC - SOB   Brief history of present illness from the day of admission and additional interim summary    Patient is a 44 y.o. male with PMHx of HTN, DM-2 morbid obesity-who was diagnosed with COVID-19 on 3/20-presented as a transfer from [place] for worsening hypoxic respiratory failure in the setting of COVID-19 pneumonia.  Significant Events: 3/20>> diagnosed with COVID-19 at [facility] in [place] 3/24>> presented to [facility] ED with shortness of breath-hypoxia secondary to COVID-19 pneumonia 3/24>> CT angio chest at [facility] ED-negative for PE. 3/25>> transferred to [facility]  COVID-19 medications: Steroids: 3/24>> Remdesivir: 3/24>> Actemra: 3/25 [facility]   Acute Hypoxic Resp Failure due to Covid 19 Viral pneumonia: He had severe disease and was treated appropriately with Actemra  on 05/14/2019, continue steroids and remdesivir, no antibiotics needed.  He is symptom-free now on room air and has completed his treatment, he still requires oxygen at night and upon ambulation but I think that is due to undiagnosed OSH and OSA, will get 2 L of nasal cannula oxygen as needed, will request PCP to arrange for outpatient pulmonary follow-up for a sleep study.  SpO2: 94 % O2 Flow Rate (L/min): 2 L/min FiO2 (%): 100 %  Recent Labs  Lab 05/15/19 0817 05/16/19 0218 05/17/19 0210 05/18/19 0434 05/18/19 0700 05/18/19 0715 05/19/19 0242  CRP 6.3* 2.7* 1.1*  --  0.8  --  0.6  DDIMER 15.62* 13.11* >20.00* 15.70*  --   --  13.05*  FERRITIN 1,175* 952*  --   --   --   --   --   BNP 50.0 136.8* 70.8  --   --  81.4 43.2  PROCALCITON 0.14 0.23 0.15  --  <0.10  --   --     Hepatic Function Latest Ref Rng & Units 05/19/2019 05/18/2019 05/17/2019  Total Protein 6.5 - 8.1 g/dL 6.7 6.4(L) 6.9  Albumin 3.5 - 5.0 g/dL 2.8(L) 2.7(L) 2.8(L)  AST 15 - 41 U/L 43(H) 38 50(H)  ALT 0 - 44 U/L 80(H) 76(H) 96(H)  Alk Phosphatase 38 - 126 U/L 96 96 103  Total Bilirubin 0.3 - 1.2 mg/dL 0.5 0.5 0.8    Transaminitis: Secondary to COVID-19-asymptomatic with stable trend, PCP to repeat CMP in 7 to 10 days.  Probable OSA: I have recommended outpatient sleep study.  Currently on high flow oxygen-follow for now.  Obesity: BMI 51 follow with PCP for weight loss.   Elevated D-dimer: Secondary to COVID-19-thankfully hypoxia improving.  [facility] 3/24 at [facility]- for PE.  Negative lower extremity duplex.    Placed on full dose Lovenox for 3 days with downtrending D-dimer, he is still high risk for getting a DVT PE due to intense inflammation from Covid and his morbid obesity, I will place him on prophylactic Eliquis for 2 weeks, coupons have been provided.  Transaminitis: Secondary to COVID-19-follow closely while on remdesivir.  HTN: BP controlled without the use of any antihypertensives-follow for  now.  DM-2 with uncontrolled hyperglycemia secondary to steroids (A1c 9.3 on 3/25): Poor outpatient control due to hyperglycemia with A1c of 9.3, Glucophage continued, received diabetic insulin education, has been placed on Lantus along with sliding scale with testing supplies.  Will follow with PCP in 7 to 10 days.  Lab Results  Component Value Date   HGBA1C 9.3 (H) 05/14/2019   CBG (last 3)  Recent Labs    05/18/19 2106 05/18/19 2110 05/19/19 0741  GLUCAP 405* 366* 151*     Discharge diagnosis     Principal Problem:   Pneumonia due to COVID-19 virus Active Problems:   Acute respiratory failure with hypoxia (HCC)   Elevated liver enzymes   Morbid obesity with BMI of 50.0-59.9, adult (HCC)   Hyperglycemia    Discharge instructions    Discharge Instructions    Discharge instructions   Complete by: As directed    Follow with Primary MD in 7 days   Get CBC, CMP, 2 view Chest X ray -  checked next visit within 1 week by Primary MD  Activity: As tolerated with Full fall precautions use walker/cane & assistance as needed  Disposition Home   Diet: Heart Healthy  Low Carb  Diet Order   Accuchecks 4 times/day, Once in AM empty stomach and then before each meal. Log in all results and show them to your Prim.MD in 3 days. If any glucose reading is under 80 or above 300 call your Prim MD immidiately. Follow Low glucose instructions for glucose under 80 as instructed.   Special Instructions: If you have smoked or chewed Tobacco  in the last 2 yrs please stop smoking, stop any regular Alcohol  and or any Recreational drug use.  On your next visit with your primary care physician please Get Medicines reviewed and adjusted.  Please request your Prim.MD to go over all Hospital Tests and Procedure/Radiological results at the follow up, please get all Hospital records sent to your Prim MD by signing hospital release before you go home.  If you experience worsening of your  admission symptoms, develop shortness of breath, life threatening emergency, suicidal or homicidal thoughts you must seek medical  attention immediately by calling 911 or calling your MD immediately  if symptoms less severe.   Increase activity slowly   Complete by: As directed    MyChart COVID-19 home monitoring program   Complete by: May 19, 2019    Is the patient willing to use the [place] for home monitoring?: Yes   Temperature monitoring   Complete by: May 19, 2019    After how many days would you like to receive a notification of this patient's flowsheet entries?: 1      Discharge Medications   Allergies as of 05/19/2019   No Known Allergies     Medication List    TAKE these medications   acetaminophen 500 MG tablet Commonly known as: TYLENOL Take 1,000 mg by mouth every 6 (six) hours as needed for fever or headache (pain).   albuterol 108 (90 Base) MCG/ACT inhaler Commonly known as: VENTOLIN HFA Inhale 2 puffs into the lungs 4 (four) times daily as needed for shortness of breath.   apixaban 2.5 MG Tabs tablet Commonly known as: Eliquis Take 1 tablet (2.5 mg total) by mouth 2 (two) times daily.   glucose monitoring kit monitoring kit 1 each by Does not apply route 4 (four) times daily - after meals and at bedtime. 1 month Diabetic Testing Supplies for QAC-QHS accuchecks.Any brand OK. Diagnosis E11.65   ibuprofen 200 MG tablet Commonly known as: ADVIL Take 400 mg by mouth every 6 (six) hours as needed for fever or headache (pain).   insulin aspart 100 UNIT/ML injection Commonly known as: NovoLOG Substitute to any brand approved   insulin glargine 100 UNIT/ML injection Commonly known as: Lantus Inject 0.3 mLs (30 Units total) into the skin at bedtime. .   Insulin Syringe-Needle U-100 25G X 1" 1 ML Misc For 4 times a day insulin SQ, 1 month supply. Diagnosis E11.65   metFORMIN 500 MG 24 hr tablet Commonly known as: GLUCOPHAGE-XR Take 500 mg by mouth 2  (two) times daily.   olmesartan 20 MG tablet Commonly known as: BENICAR Take 20 mg by mouth at bedtime.            Durable Medical Equipment  (From admission, onward)         Start     Ordered   05/18/19 0940  For home use only DME oxygen  Once    Question Answer Comment  Length of Need 6 Months   Mode or (Route) Nasal cannula   Liters per Minute 2   Frequency Continuous (stationary and portable oxygen unit needed)   Oxygen conserving device Yes   Oxygen delivery system Gas      05/18/19 0939          Follow-up Information    [place]. Schedule an appointment as soon as possible for a visit in 1 week(s).   Contact information: [place] [id number] [phone]          Major procedures and Radiology Reports - PLEASE review detailed and final reports thoroughly  -       DG Chest Port 1V same Day  Result Date: 05/15/2019 CLINICAL DATA:  Shortness of breath. EXAM: PORTABLE CHEST 1 VIEW COMPARISON:  05/13/2019. FINDINGS: Cardiomegaly. Progressive diffuse bilateral pulmonary interstitial prominence. Interstitial edema and/or pneumonitis could present this fashion. Small right pleural effusion cannot be excluded. No pneumothorax. No acute bony abnormality. IMPRESSION: Cardiomegaly. Progressive diffuse bilateral from interstitial prominence. Interstitial edema and/or pneumonitis  could present this fashion. Small right pleural effusion cannot be excluded. Electronically Signed   By: Marcello Moores  Register   On: 05/15/2019 07:39   VAS [place] LOWER EXTREMITY VENOUS (DVT)  Result Date: 05/15/2019  Lower Venous DVTStudy Indications: Swelling, and Covid+, Elevated D-dimer.  Limitations: Body habitus and poor ultrasound/tissue interface. Comparison Study: No prior exam. Performing Technologist: Baldwin Crown ARDMS, RVT  Examination Guidelines: A complete evaluation includes B-mode imaging, spectral Doppler, color  Doppler, and power Doppler as needed of all accessible portions of each vessel. Bilateral testing is considered an integral part of a complete examination. Limited examinations for reoccurring indications may be performed as noted. The reflux portion of the exam is performed with the patient in reverse Trendelenburg.  +---------+---------------+---------+-----------+----------+------------------+ RIGHT    CompressibilityPhasicitySpontaneityPropertiesThrombus Aging     +---------+---------------+---------+-----------+----------+------------------+ CFV      Full           Yes      Yes                                     +---------+---------------+---------+-----------+----------+------------------+ SFJ      Full                                                            +---------+---------------+---------+-----------+----------+------------------+ FV Prox  Full                                                            +---------+---------------+---------+-----------+----------+------------------+ FV Mid   Full                                         visualized with                                                          color              +---------+---------------+---------+-----------+----------+------------------+ FV DistalFull                                         visualized with                                                          color              +---------+---------------+---------+-----------+----------+------------------+ PFV      Full                                                            +---------+---------------+---------+-----------+----------+------------------+  POP      Full           Yes      Yes                                     +---------+---------------+---------+-----------+----------+------------------+ PTV      Full                                         visualized with                                                           color              +---------+---------------+---------+-----------+----------+------------------+ PERO     Partial                                      visualized with                                                          color              +---------+---------------+---------+-----------+----------+------------------+   +---------+---------------+---------+-----------+----------+------------------+ LEFT     CompressibilityPhasicitySpontaneityPropertiesThrombus Aging     +---------+---------------+---------+-----------+----------+------------------+ CFV      Full           Yes      Yes                                     +---------+---------------+---------+-----------+----------+------------------+ SFJ      Full                                                            +---------+---------------+---------+-----------+----------+------------------+ FV Prox  Full                                         visualized with                                                          color              +---------+---------------+---------+-----------+----------+------------------+ FV Mid   Full  visualized with                                                          color              +---------+---------------+---------+-----------+----------+------------------+ FV DistalFull                                         visualized with                                                          color              +---------+---------------+---------+-----------+----------+------------------+ PFV      Full                                                            +---------+---------------+---------+-----------+----------+------------------+ POP      Full           Yes      Yes                                      +---------+---------------+---------+-----------+----------+------------------+ PTV      Full                                         visualized with                                                          color              +---------+---------------+---------+-----------+----------+------------------+ PERO     Full                                         visualized with                                                          color              +---------+---------------+---------+-----------+----------+------------------+     Summary: RIGHT: - There is no evidence of deep vein thrombosis in the lower extremity. However, portions of this examination were limited- see technologist comments above.  - No cystic structure found in the popliteal fossa.  LEFT: - There is no evidence of deep vein thrombosis in the lower extremity. However, portions of this examination were limited- see technologist comments above.  - No cystic structure found in the popliteal fossa.  *See table(s) above for measurements and observations. Electronically signed by Deitra Mayo MD on 05/15/2019 at 4:51:08 PM.    Final     Micro Results     Recent Results (from the past 240 hour(s))  MRSA PCR Screening     Status: Abnormal   Collection Time: 05/14/19  1:45 PM   Specimen: Nasal Mucosa; Nasopharyngeal  Result Value Ref Range Status   MRSA by PCR POSITIVE (A) NEGATIVE Final    Comment:        The GeneXpert MRSA Assay (FDA approved for NASAL specimens only), is one component of a comprehensive MRSA colonization surveillance program. It is not intended to diagnose MRSA infection nor to guide or monitor treatment for MRSA infections. RESULT CALLED TO, READ BACK BY AND VERIFIED WITH: Shawnie Pons RN 16:00 05/14/19 ([email or web address]) Performed at [facility], [place] [address], [place], [place] [address]     Today   Subjective    Victor Crawford today has no headache,no chest abdominal pain,no new  weakness tingling or numbness, feels much better wants to go home today.     Objective   Blood pressure (!) 126/94, pulse 77, temperature 97.9 F (36.6 C), temperature source Oral, resp. rate 18, height 5' 5"  (1.651 m), weight (!) 140.3 kg, SpO2 94 %.   Intake/Output Summary (Last 24 hours) at 05/19/2019 0935 Last data filed at 05/19/2019 0845 Gross per 24 hour  Intake 1200 ml  Output 700 ml  Net 500 ml    Exam Awake Alert, Oriented x 3, No new F.N deficits, Normal affect [place].AT,PERRAL Supple Neck,No JVD, No cervical lymphadenopathy appriciated.  Symmetrical Chest wall movement, Good air movement bilaterally, CTAB RRR,No Gallops,Rubs or new Murmurs, No Parasternal Heave +ve B.Sounds, Abd Soft, Non tender, No organomegaly appriciated, No rebound -guarding or rigidity. No Cyanosis, Clubbing or edema, No new Rash or bruise   Data Review   CBC w Diff:  Lab Results  Component Value Date   WBC 17.7 (H) 05/19/2019   HGB 15.2 05/19/2019   HCT 46.6 05/19/2019   PLT 289 05/19/2019   LYMPHOPCT 12 05/19/2019   MONOPCT 7 05/19/2019   EOSPCT 0 05/19/2019   BASOPCT 1 05/19/2019    CMP:  Lab Results  Component Value Date   NA 137 05/19/2019   K 4.3 05/19/2019   CL 105 05/19/2019   CO2 23 05/19/2019   BUN 17 05/19/2019   CREATININE 0.91 05/19/2019   PROT 6.7 05/19/2019   ALBUMIN 2.8 (L) 05/19/2019   BILITOT 0.5 05/19/2019   ALKPHOS 96 05/19/2019   AST 43 (H) 05/19/2019   ALT 80 (H) 05/19/2019  .   Total Time in preparing paper work, data evaluation and todays exam - 49 minutes  Lala Lund M.D on 05/19/2019 at 9:35 AM  [facility]   Office  [phone]

## 2019-05-19 NOTE — Discharge Instructions (Addendum)
Follow with Primary MD in 7 days   Get CBC, CMP, 2 view Chest X ray -  checked next visit within 1 week by Primary MD    Activity: As tolerated with Full fall precautions use walker/cane & assistance as needed  Disposition Home   Diet: Heart Healthy Low Carb  Accuchecks 4 times/day, Once in AM empty stomach and then before each meal. Log in all results and show them to your Prim.MD in 3 days. If any glucose reading is under 80 or above 300 call your Prim MD immidiately. Follow Low glucose instructions for glucose under 80 as instructed.  Special Instructions: If you have smoked or chewed Tobacco  in the last 2 yrs please stop smoking, stop any regular Alcohol  and or any Recreational drug use.  On your next visit with your primary care physician please Get Medicines reviewed and adjusted.  Please request your Prim.MD to go over all Hospital Tests and Procedure/Radiological results at the follow up, please get all Hospital records sent to your Prim MD by signing hospital release before you go home.  If you experience worsening of your admission symptoms, develop shortness of breath, life threatening emergency, suicidal or homicidal thoughts you must seek medical attention immediately by calling 911 or calling your MD immediately  if symptoms less severe.            Person Under Monitoring Name: Victor Crawford  Location: [address] [place] [address]   Infection Prevention Recommendations for Individuals Confirmed to have, or Being Evaluated for, 2019 Novel Coronavirus (COVID-19) Infection Who Receive Care at Home  Individuals who are confirmed to have, or are being evaluated for, COVID-19 should follow the prevention steps below until a healthcare provider or local or state health department says they can return to normal activities.  Stay home except to get medical care You should restrict activities outside your home, except for getting medical care. Do not go to work,  school, or public areas, and do not use public transportation or taxis.  Call ahead before visiting your doctor Before your medical appointment, call the healthcare provider and tell them that you have, or are being evaluated for, COVID-19 infection. This will help the healthcare provider's office take steps to keep other people from getting infected. Ask your healthcare provider to call the local or state health department.  Monitor your symptoms Seek prompt medical attention if your illness is worsening (e.g., difficulty breathing). Before going to your medical appointment, call the healthcare provider and tell them that you have, or are being evaluated for, COVID-19 infection. Ask your healthcare provider to call the local or state health department.  Wear a facemask You should wear a facemask that covers your nose and mouth when you are in the same room with other people and when you visit a healthcare provider. People who live with or visit you should also wear a facemask while they are in the same room with you.  Separate yourself from other people in your home As much as possible, you should stay in a different room from other people in your home. Also, you should use a separate bathroom, if available.  Avoid sharing household items You should not share dishes, drinking glasses, cups, eating utensils, towels, bedding, or other items with other people in your home. After using these items, you should wash them thoroughly with soap and water.  Cover your coughs and sneezes Cover your mouth and nose with a tissue when you  cough or sneeze, or you can cough or sneeze into your sleeve. Throw used tissues in a lined trash can, and immediately wash your hands with soap and water for at least 20 seconds or use an alcohol-based hand rub.  Wash your [employer] your hands often and thoroughly with soap and water for at least 20 seconds. You can use an alcohol-based hand sanitizer if soap  and water are not available and if your hands are not visibly dirty. Avoid touching your eyes, nose, and mouth with unwashed hands.   Prevention Steps for Caregivers and Household Members of Individuals Confirmed to have, or Being Evaluated for, COVID-19 Infection Being Cared for in the Home  If you live with, or provide care at home for, a person confirmed to have, or being evaluated for, COVID-19 infection please follow these guidelines to prevent infection:  Follow healthcare provider's instructions Make sure that you understand and can help the patient follow any healthcare provider instructions for all care.  Provide for the patient's basic needs You should help the patient with basic needs in the home and provide support for getting groceries, prescriptions, and other personal needs.  Monitor the patient's symptoms If they are getting sicker, call his or her medical provider and tell them that the patient has, or is being evaluated for, COVID-19 infection. This will help the healthcare provider's office take steps to keep other people from getting infected. Ask the healthcare provider to call the local or state health department.  Limit the number of people who have contact with the patient  If possible, have only one caregiver for the patient.  Other household members should stay in another home or place of residence. If this is not possible, they should stay  in another room, or be separated from the patient as much as possible. Use a separate bathroom, if available.  Restrict visitors who do not have an essential need to be in the home.  Keep older adults, very young children, and other sick people away from the patient Keep older adults, very young children, and those who have compromised immune systems or chronic health conditions away from the patient. This includes people with chronic heart, lung, or kidney conditions, diabetes, and cancer.  Ensure good  ventilation Make sure that shared spaces in the home have good air flow, such as from an air conditioner or an opened window, weather permitting.  Wash your hands often  Wash your hands often and thoroughly with soap and water for at least 20 seconds. You can use an alcohol based hand sanitizer if soap and water are not available and if your hands are not visibly dirty.  Avoid touching your eyes, nose, and mouth with unwashed hands.  Use disposable paper towels to dry your hands. If not available, use dedicated cloth towels and replace them when they become wet.  Wear a facemask and gloves  Wear a disposable facemask at all times in the room and gloves when you touch or have contact with the patient's blood, body fluids, and/or secretions or excretions, such as sweat, saliva, sputum, nasal mucus, vomit, urine, or feces.  Ensure the mask fits over your nose and mouth tightly, and do not touch it during use.  Throw out disposable facemasks and gloves after using them. Do not reuse.  Wash your hands immediately after removing your facemask and gloves.  If your personal clothing becomes contaminated, carefully remove clothing and launder. Wash your hands after handling contaminated clothing.  Place  all used disposable facemasks, gloves, and other waste in a lined container before disposing them with other household waste.  Remove gloves and wash your hands immediately after handling these items.  Do not share dishes, glasses, or other household items with the patient  Avoid sharing household items. You should not share dishes, drinking glasses, cups, eating utensils, towels, bedding, or other items with a patient who is confirmed to have, or being evaluated for, COVID-19 infection.  After the person uses these items, you should wash them thoroughly with soap and water.  Wash laundry thoroughly  Immediately remove and wash clothes or bedding that have blood, body fluids, and/or  secretions or excretions, such as sweat, saliva, sputum, nasal mucus, vomit, urine, or feces, on them.  Wear gloves when handling laundry from the patient.  Read and follow directions on labels of laundry or clothing items and detergent. In general, wash and dry with the warmest temperatures recommended on the label.  Clean all areas the individual has used often  Clean all touchable surfaces, such as counters, tabletops, doorknobs, bathroom fixtures, toilets, phones, keyboards, tablets, and bedside tables, every day. Also, clean any surfaces that may have blood, body fluids, and/or secretions or excretions on them.  Wear gloves when cleaning surfaces the patient has come in contact with.  Use a diluted bleach solution (e.g., dilute bleach with 1 part bleach and 10 parts water) or a household disinfectant with a label that says EPA-registered for coronaviruses. To make a bleach solution at home, add 1 tablespoon of bleach to 1 quart (4 cups) of water. For a larger supply, add  cup of bleach to 1 gallon (16 cups) of water.  Read labels of cleaning products and follow recommendations provided on product labels. Labels contain instructions for safe and effective use of the cleaning product including precautions you should take when applying the product, such as wearing gloves or eye protection and making sure you have good ventilation during use of the product.  Remove gloves and wash hands immediately after cleaning.  Monitor yourself for signs and symptoms of illness Caregivers and household members are considered close contacts, should monitor their health, and will be asked to limit movement outside of the home to the extent possible. Follow the monitoring steps for close contacts listed on the symptom monitoring form.   ? If you have additional questions, contact your local health department or call the epidemiologist on call at [phone] (available 24/7). ? This guidance is subject  to change. For the most up-to-date guidance from [facility], please refer to their website: YouBlogs.pl   Carbohydrate Counting For People With Diabetes  Foods with carbohydrates make your blood glucose level go up. Learning how to count carbohydrates can help you control your blood glucose levels. First, identify the foods you eat that contain carbohydrates. Then, using the Foods with Carbohydrates chart, determine about how much carbohydrates are in your meals and snacks. Make sure you are eating foods with fiber, protein, and healthy fat along with your carbohydrate foods. Foods with Carbohydrates The following table shows carbohydrate foods that have about 15 grams of carbohydrate each. Using measuring cups, spoons, or a food scale when you first begin learning about carbohydrate counting can help you learn about the portion sizes you typically eat. The following foods have 15 grams carbohydrate each:  Grains . 1 slice bread (1 ounce)  . 1 small tortilla (6-inch size)  .  large bagel (1 ounce)  . 1/3 cup pasta or rice (  cooked)  .  hamburger or hot dog bun ( ounce)  .  cup cooked cereal  .  to  cup ready-to-eat cereal  . 2 taco shells (5-inch size) Fruit . 1 small fresh fruit ( to 1 cup)  .  medium banana  . 17 small grapes (3 ounces)  . 1 cup melon or berries  .  cup canned or frozen fruit  . 2 tablespoons dried fruit (blueberries, cherries, cranberries, raisins)  .  cup unsweetened fruit juice  Starchy Vegetables .  cup cooked beans, peas, corn, potatoes/sweet potatoes  .  large baked potato (3 ounces)  . 1 cup acorn or butternut squash  Snack Foods . 3 to 6 crackers  . 8 potato chips or 13 tortilla chips ( ounce to 1 ounce)  . 3 cups popped popcorn  Dairy . 3/4 cup (6 ounces) nonfat plain yogurt, or yogurt with sugar-free sweetener  . 1 cup milk  . 1 cup plain rice, soy, coconut or flavored almond milk  Sweets and Desserts .  cup ice cream or frozen yogurt  . 1 tablespoon jam, jelly, pancake syrup, table sugar, or honey  . 2 tablespoons light pancake syrup  . 1 inch square of frosted cake or 2 inch square of unfrosted cake  . 2 small cookies (2/3 ounce each) or  large cookie  Sometimes you'll have to estimate carbohydrate amounts if you don't know the exact recipe. One cup of mixed foods like soups can have 1 to 2 carbohydrate servings, while some casseroles might have 2 or more servings of carbohydrate. Foods that have less than 20 calories in each serving can be counted as "free" foods. Count 1 cup raw vegetables, or  cup cooked non-starchy vegetables as "free" foods. If you eat 3 or more servings at one meal, then count them as 1 carbohydrate serving.  Foods without Carbohydrates  Not all foods contain carbohydrates. Meat, some dairy, fats, non-starchy vegetables, and many beverages don't contain carbohydrate. So when you count carbohydrates, you can generally exclude chicken, pork, beef, fish, seafood, eggs, tofu, cheese, butter, sour cream, avocado, nuts, seeds, olives, mayonnaise, water, black coffee, unsweetened tea, and zero-calorie drinks. Vegetables with no or low carbohydrate include green beans, cauliflower, tomatoes, and onions. How much carbohydrate should I eat at each meal?  Carbohydrate counting can help you plan your meals and manage your weight. Following are some starting points for carbohydrate intake at each meal. Work with your registered dietitian nutritionist to find the best range that works for your blood glucose and weight.   To Lose Weight To Maintain Weight  Women 2 - 3 carb servings 3 - 4 carb servings  Men 3 - 4 carb servings 4 - 5 carb servings  Checking your blood glucose after meals will help you know if you need to adjust the timing, type, or number of carbohydrate servings in your meal plan. Achieve and keep a healthy body weight by balancing your food intake  and physical activity.  Tips How should I plan my meals?  Plan for half the food on your plate to include non-starchy vegetables, like salad greens, broccoli, or carrots. Try to eat 3 to 5 servings of non-starchy vegetables every day. Have a protein food at each meal. Protein foods include chicken, fish, meat, eggs, or beans (note that beans contain carbohydrate). These two food groups (non-starchy vegetables and proteins) are low in carbohydrate. If you fill up your plate with these foods, you will eat less  carbohydrate but still fill up your stomach. Try to limit your carbohydrate portion to  of the plate.  What fats are healthiest to eat?  Diabetes increases risk for heart disease. To help protect your heart, eat more healthy fats, such as olive oil, nuts, and avocado. Eat less saturated fats like butter, cream, and high-fat meats, like bacon and sausage. Avoid trans fats, which are in all foods that list "partially hydrogenated oil" as an ingredient. What should I drink?  Choose drinks that are not sweetened with sugar. The healthiest choices are water, carbonated or seltzer waters, and tea and coffee without added sugars.  Sweet drinks will make your blood glucose go up very quickly. One serving of soda or energy drink is  cup. It is best to drink these beverages only if your blood glucose is low.  Artificially sweetened, or diet drinks, typically do not increase your blood glucose if they have zero calories in them. Read labels of beverages, as some diet drinks do have carbohydrate and will raise your blood glucose. Label Reading Tips Read Nutrition Facts labels to find out how many grams of carbohydrate are in a food you want to eat. Don't forget: sometimes serving sizes on the label aren't the same as how much food you are going to eat, so you may need to calculate how much carbohydrate is in the food you are serving yourself.   Carbohydrate Counting for People with Diabetes Sample 1-Day Menu   Breakfast  cup yogurt, low fat, low sugar (1 carbohydrate serving)   cup cereal, ready-to-eat, unsweetened (1 carbohydrate serving)  1 cup strawberries (1 carbohydrate serving)   cup almonds ( carbohydrate serving)  Lunch 1, 5 ounce can chunk light tuna  2 ounces cheese, low fat cheddar  6 whole wheat crackers (1 carbohydrate serving)  1 small apple (1 carbohydrate servings)   cup carrots ( carbohydrate serving)   cup snap peas  1 cup 1% milk (1 carbohydrate serving)   Evening Meal Stir fry made with: 3 ounces chicken  1 cup brown rice (3 carbohydrate servings)   cup broccoli ( carbohydrate serving)   cup green beans   cup onions  1 tablespoon olive oil  2 tablespoons teriyaki sauce ( carbohydrate serving)  Evening Snack 1 extra small banana (1 carbohydrate serving)  1 tablespoon peanut butter   Carbohydrate Counting for People with Diabetes Vegan Sample 1-Day Menu  Breakfast 1 cup cooked oatmeal (2 carbohydrate servings)   cup blueberries (1 carbohydrate serving)  2 tablespoons flaxseeds  1 cup soymilk fortified with calcium and vitamin D  1 cup coffee  Lunch 2 slices whole wheat bread (2 carbohydrate servings)   cup baked tofu   cup lettuce  2 slices tomato  2 slices avocado   cup baby carrots ( carbohydrate serving)  1 orange (1 carbohydrate serving)  1 cup soymilk fortified with calcium and vitamin D   Evening Meal Burrito made with: 1 6-inch corn tortilla (1 carbohydrate serving)  1 cup refried vegetarian beans (2 carbohydrate servings)   cup chopped tomatoes   cup lettuce   cup salsa  1/3 cup brown rice (1 carbohydrate serving)  1 tablespoon olive oil for rice   cup zucchini   Evening Snack 6 small whole grain crackers (1 carbohydrate serving)  2 apricots ( carbohydrate serving)   cup unsalted peanuts ( carbohydrate serving)    Carbohydrate Counting for People with Diabetes Vegetarian (Lacto-Ovo) Sample 1-Day Menu  Breakfast 1 cup  cooked oatmeal (  2 carbohydrate servings)   cup blueberries (1 carbohydrate serving)  2 tablespoons flaxseeds  1 egg  1 cup 1% milk (1 carbohydrate serving)  1 cup coffee  Lunch 2 slices whole wheat bread (2 carbohydrate servings)  2 ounces low-fat cheese   cup lettuce  2 slices tomato  2 slices avocado   cup baby carrots ( carbohydrate serving)  1 orange (1 carbohydrate serving)  1 cup unsweetened tea  Evening Meal Burrito made with: 1 6-inch corn tortilla (1 carbohydrate serving)   cup refried vegetarian beans (1 carbohydrate serving)   cup tomatoes   cup lettuce   cup salsa  1/3 cup brown rice (1 carbohydrate serving)  1 tablespoon olive oil for rice   cup zucchini  1 cup 1% milk (1 carbohydrate serving)  Evening Snack 6 small whole grain crackers (1 carbohydrate serving)  2 apricots ( carbohydrate serving)   cup unsalted peanuts ( carbohydrate serving)    Copyright 2020  Academy of [employer] and [employer]etics. All rights reserved.  Using Nutrition Labels: Carbohydrate  . Serving Size  . Look at the serving size. All the information on the label is based on this portion. Jolyne Loa Per Container  . The number of servings contained in the package. . Guidelines for Carbohydrate  . Look at the total grams of carbohydrate in the serving size.  . 1 carbohydrate choice = 15 grams of carbohydrate. Range of Carbohydrate Grams Per Choice  Carbohydrate Grams/Choice Carbohydrate Choices  6-10   11-20 1  21-25 1  26-35 2  36-40 2  41-50 3  51-55 3  56-65 4  66-70 4  71-80 5    Copyright 2020  Academy of [employer] and Dietetics. All rights reserved.  Roslyn Smiling, MS, RD, LDN Clinical Dietitian Office phone #[phone]      Information on my medicine - ELIQUIS (apixaban)  Why was Eliquis prescribed for you? Eliquis was prescribed for you to reduce the risk of forming blood clots that can cause a stroke if you have a medical condition called  atrial fibrillation (a type of irregular heartbeat) OR to reduce the risk of a blood clots forming after orthopedic surgery.  What do You need to know about Eliquis ? Take your Eliquis TWICE DAILY - one tablet in the morning and one tablet in the evening with or without food.  It would be best to take the doses about the same time each day.  If you have difficulty swallowing the tablet whole please discuss with your pharmacist how to take the medication safely.  Take Eliquis exactly as prescribed by your doctor and DO NOT stop taking Eliquis without talking to the doctor who prescribed the medication.  Stopping may increase your risk of developing a new clot or stroke.  Refill your prescription before you run out.  After discharge, you should have regular check-up appointments with your healthcare provider that is prescribing your Eliquis.  In the future your dose may need to be changed if your kidney function or weight changes by a significant amount or as you get older.  What do you do if you miss a dose? If you miss a dose, take it as soon as you remember on the same day and resume taking twice daily.  Do not take more than one dose of ELIQUIS at the same time.  Important Safety Information A possible side effect of Eliquis is bleeding. You should call your healthcare provider right away if you experience any of  the following: ? Bleeding from an injury or your nose that does not stop. ? Unusual colored urine (red or dark brown) or unusual colored stools (red or black). ? Unusual bruising for unknown reasons. ? A serious fall or if you hit your head (even if there is no bleeding).  Some medicines may interact with Eliquis and might increase your risk of bleeding or clotting while on Eliquis. To help avoid this, consult your healthcare provider or pharmacist prior to using any new prescription or non-prescription medications, including herbals, vitamins, non-steroidal anti-inflammatory  drugs (NSAIDs) and supplements.  This website has more information on Eliquis (apixaban): [email or web address].cy.

## 2020-10-10 DIAGNOSIS — E785 Hyperlipidemia, unspecified: Secondary | ICD-10-CM | POA: Diagnosis not present

## 2020-10-10 DIAGNOSIS — I10 Essential (primary) hypertension: Secondary | ICD-10-CM | POA: Diagnosis not present

## 2020-10-10 DIAGNOSIS — E1169 Type 2 diabetes mellitus with other specified complication: Secondary | ICD-10-CM | POA: Diagnosis not present

## 2020-12-14 DIAGNOSIS — Z6841 Body Mass Index (BMI) 40.0 and over, adult: Secondary | ICD-10-CM | POA: Diagnosis not present

## 2020-12-14 DIAGNOSIS — I10 Essential (primary) hypertension: Secondary | ICD-10-CM | POA: Diagnosis not present

## 2020-12-14 DIAGNOSIS — Z2821 Immunization not carried out because of patient refusal: Secondary | ICD-10-CM | POA: Diagnosis not present

## 2020-12-14 DIAGNOSIS — E1169 Type 2 diabetes mellitus with other specified complication: Secondary | ICD-10-CM | POA: Diagnosis not present

## 2021-01-23 DIAGNOSIS — Z01818 Encounter for other preprocedural examination: Secondary | ICD-10-CM | POA: Diagnosis not present

## 2021-02-17 DIAGNOSIS — D123 Benign neoplasm of transverse colon: Secondary | ICD-10-CM | POA: Diagnosis not present

## 2021-02-17 DIAGNOSIS — K5730 Diverticulosis of large intestine without perforation or abscess without bleeding: Secondary | ICD-10-CM | POA: Diagnosis not present

## 2021-02-17 DIAGNOSIS — Z800 Family history of malignant neoplasm of digestive organs: Secondary | ICD-10-CM | POA: Diagnosis not present

## 2021-02-17 DIAGNOSIS — E119 Type 2 diabetes mellitus without complications: Secondary | ICD-10-CM | POA: Diagnosis not present

## 2021-02-17 DIAGNOSIS — D126 Benign neoplasm of colon, unspecified: Secondary | ICD-10-CM | POA: Diagnosis not present

## 2021-02-17 DIAGNOSIS — Z7984 Long term (current) use of oral hypoglycemic drugs: Secondary | ICD-10-CM | POA: Diagnosis not present

## 2021-02-17 DIAGNOSIS — K6389 Other specified diseases of intestine: Secondary | ICD-10-CM | POA: Diagnosis not present

## 2021-02-17 DIAGNOSIS — Z1211 Encounter for screening for malignant neoplasm of colon: Secondary | ICD-10-CM | POA: Diagnosis not present

## 2021-02-17 DIAGNOSIS — Z87891 Personal history of nicotine dependence: Secondary | ICD-10-CM | POA: Diagnosis not present

## 2021-03-20 DIAGNOSIS — I10 Essential (primary) hypertension: Secondary | ICD-10-CM | POA: Diagnosis not present

## 2021-03-20 DIAGNOSIS — E1169 Type 2 diabetes mellitus with other specified complication: Secondary | ICD-10-CM | POA: Diagnosis not present

## 2021-03-20 DIAGNOSIS — Z6841 Body Mass Index (BMI) 40.0 and over, adult: Secondary | ICD-10-CM | POA: Diagnosis not present

## 2021-03-21 DIAGNOSIS — I10 Essential (primary) hypertension: Secondary | ICD-10-CM | POA: Diagnosis not present

## 2021-03-21 DIAGNOSIS — E1169 Type 2 diabetes mellitus with other specified complication: Secondary | ICD-10-CM | POA: Diagnosis not present

## 2021-05-23 DIAGNOSIS — H33321 Round hole, right eye: Secondary | ICD-10-CM | POA: Diagnosis not present

## 2021-05-23 DIAGNOSIS — E119 Type 2 diabetes mellitus without complications: Secondary | ICD-10-CM | POA: Diagnosis not present

## 2021-06-26 DIAGNOSIS — E1169 Type 2 diabetes mellitus with other specified complication: Secondary | ICD-10-CM | POA: Diagnosis not present

## 2021-06-26 DIAGNOSIS — Z6841 Body Mass Index (BMI) 40.0 and over, adult: Secondary | ICD-10-CM | POA: Diagnosis not present

## 2021-06-26 DIAGNOSIS — I10 Essential (primary) hypertension: Secondary | ICD-10-CM | POA: Diagnosis not present

## 2021-06-27 DIAGNOSIS — I10 Essential (primary) hypertension: Secondary | ICD-10-CM | POA: Diagnosis not present

## 2021-06-27 DIAGNOSIS — E1169 Type 2 diabetes mellitus with other specified complication: Secondary | ICD-10-CM | POA: Diagnosis not present

## 2021-07-22 IMAGING — DX DG CHEST 1V PORT SAME DAY
1 series · 1 of 1 positions shown · non-contrast
Comparison: 05/13/2019.

CLINICAL DATA: Shortness of breath.

EXAM:
PORTABLE CHEST 1 VIEW

[chest ap]
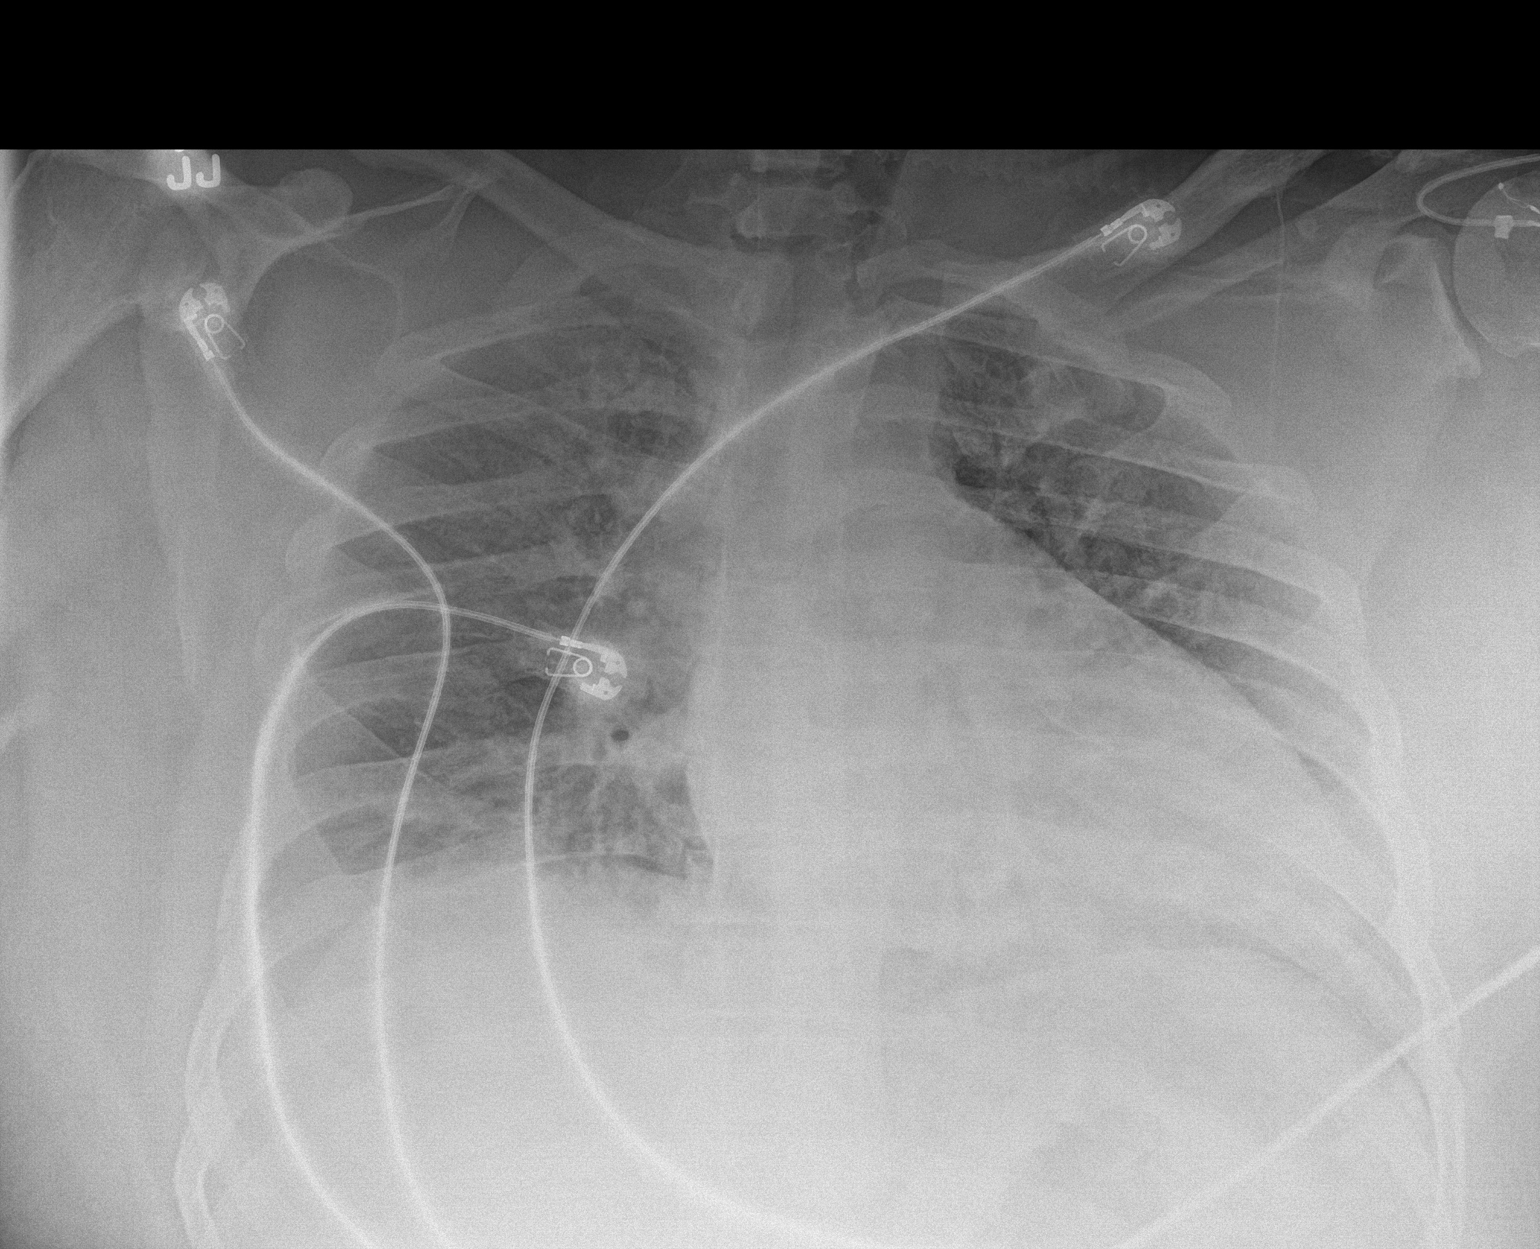

[1 of 1 positions shown; findings below may reference images not displayed]

FINDINGS: Cardiomegaly. Progressive diffuse bilateral pulmonary interstitial
prominence. Interstitial edema and/or pneumonitis could present this
fashion. Small right pleural effusion cannot be excluded. No
pneumothorax. No acute bony abnormality.
IMPRESSION: Cardiomegaly. Progressive diffuse bilateral from interstitial
prominence. Interstitial edema and/or pneumonitis could present this
fashion. Small right pleural effusion cannot be excluded.

## 2021-09-05 DIAGNOSIS — M25561 Pain in right knee: Secondary | ICD-10-CM | POA: Diagnosis not present

## 2021-09-05 DIAGNOSIS — G8929 Other chronic pain: Secondary | ICD-10-CM | POA: Diagnosis not present

## 2021-09-05 DIAGNOSIS — M25562 Pain in left knee: Secondary | ICD-10-CM | POA: Diagnosis not present

## 2021-09-05 DIAGNOSIS — M170 Bilateral primary osteoarthritis of knee: Secondary | ICD-10-CM | POA: Diagnosis not present

## 2021-10-02 DIAGNOSIS — I10 Essential (primary) hypertension: Secondary | ICD-10-CM | POA: Diagnosis not present

## 2021-10-02 DIAGNOSIS — Z6841 Body Mass Index (BMI) 40.0 and over, adult: Secondary | ICD-10-CM | POA: Diagnosis not present

## 2021-10-02 DIAGNOSIS — E1169 Type 2 diabetes mellitus with other specified complication: Secondary | ICD-10-CM | POA: Diagnosis not present

## 2021-10-03 DIAGNOSIS — I10 Essential (primary) hypertension: Secondary | ICD-10-CM | POA: Diagnosis not present

## 2021-10-03 DIAGNOSIS — E1169 Type 2 diabetes mellitus with other specified complication: Secondary | ICD-10-CM | POA: Diagnosis not present

## 2021-11-06 DIAGNOSIS — R944 Abnormal results of kidney function studies: Secondary | ICD-10-CM | POA: Diagnosis not present

## 2021-11-07 DIAGNOSIS — R944 Abnormal results of kidney function studies: Secondary | ICD-10-CM | POA: Diagnosis not present

## 2022-02-05 DIAGNOSIS — Z6841 Body Mass Index (BMI) 40.0 and over, adult: Secondary | ICD-10-CM | POA: Diagnosis not present

## 2022-02-05 DIAGNOSIS — I10 Essential (primary) hypertension: Secondary | ICD-10-CM | POA: Diagnosis not present

## 2022-02-05 DIAGNOSIS — E1169 Type 2 diabetes mellitus with other specified complication: Secondary | ICD-10-CM | POA: Diagnosis not present

## 2022-02-05 DIAGNOSIS — Z2821 Immunization not carried out because of patient refusal: Secondary | ICD-10-CM | POA: Diagnosis not present

## 2022-02-06 DIAGNOSIS — E1169 Type 2 diabetes mellitus with other specified complication: Secondary | ICD-10-CM | POA: Diagnosis not present

## 2022-02-06 DIAGNOSIS — I10 Essential (primary) hypertension: Secondary | ICD-10-CM | POA: Diagnosis not present

## 2022-07-02 DIAGNOSIS — I10 Essential (primary) hypertension: Secondary | ICD-10-CM | POA: Diagnosis not present

## 2022-07-02 DIAGNOSIS — Z6841 Body Mass Index (BMI) 40.0 and over, adult: Secondary | ICD-10-CM | POA: Diagnosis not present

## 2022-07-02 DIAGNOSIS — E1169 Type 2 diabetes mellitus with other specified complication: Secondary | ICD-10-CM | POA: Diagnosis not present

## 2022-07-02 DIAGNOSIS — E785 Hyperlipidemia, unspecified: Secondary | ICD-10-CM | POA: Diagnosis not present

## 2022-07-03 DIAGNOSIS — I10 Essential (primary) hypertension: Secondary | ICD-10-CM | POA: Diagnosis not present

## 2022-07-03 DIAGNOSIS — E1169 Type 2 diabetes mellitus with other specified complication: Secondary | ICD-10-CM | POA: Diagnosis not present

## 2022-07-03 DIAGNOSIS — E7800 Pure hypercholesterolemia, unspecified: Secondary | ICD-10-CM | POA: Diagnosis not present

## 2022-10-08 DIAGNOSIS — I10 Essential (primary) hypertension: Secondary | ICD-10-CM | POA: Diagnosis not present

## 2022-10-08 DIAGNOSIS — Z6841 Body Mass Index (BMI) 40.0 and over, adult: Secondary | ICD-10-CM | POA: Diagnosis not present

## 2022-10-08 DIAGNOSIS — E1169 Type 2 diabetes mellitus with other specified complication: Secondary | ICD-10-CM | POA: Diagnosis not present

## 2022-10-08 DIAGNOSIS — E785 Hyperlipidemia, unspecified: Secondary | ICD-10-CM | POA: Diagnosis not present

## 2022-10-09 DIAGNOSIS — I10 Essential (primary) hypertension: Secondary | ICD-10-CM | POA: Diagnosis not present

## 2022-10-09 DIAGNOSIS — E1169 Type 2 diabetes mellitus with other specified complication: Secondary | ICD-10-CM | POA: Diagnosis not present

## 2023-01-14 DIAGNOSIS — E1169 Type 2 diabetes mellitus with other specified complication: Secondary | ICD-10-CM | POA: Diagnosis not present

## 2023-01-14 DIAGNOSIS — I10 Essential (primary) hypertension: Secondary | ICD-10-CM | POA: Diagnosis not present

## 2023-01-14 DIAGNOSIS — Z2821 Immunization not carried out because of patient refusal: Secondary | ICD-10-CM | POA: Diagnosis not present

## 2023-01-14 DIAGNOSIS — E785 Hyperlipidemia, unspecified: Secondary | ICD-10-CM | POA: Diagnosis not present

## 2023-01-15 DIAGNOSIS — E1169 Type 2 diabetes mellitus with other specified complication: Secondary | ICD-10-CM | POA: Diagnosis not present

## 2023-01-15 DIAGNOSIS — E785 Hyperlipidemia, unspecified: Secondary | ICD-10-CM | POA: Diagnosis not present

## 2023-01-15 DIAGNOSIS — I10 Essential (primary) hypertension: Secondary | ICD-10-CM | POA: Diagnosis not present

## 2023-05-20 DIAGNOSIS — E785 Hyperlipidemia, unspecified: Secondary | ICD-10-CM | POA: Diagnosis not present

## 2023-05-20 DIAGNOSIS — Z6841 Body Mass Index (BMI) 40.0 and over, adult: Secondary | ICD-10-CM | POA: Diagnosis not present

## 2023-05-20 DIAGNOSIS — E119 Type 2 diabetes mellitus without complications: Secondary | ICD-10-CM | POA: Diagnosis not present

## 2023-05-20 DIAGNOSIS — E1169 Type 2 diabetes mellitus with other specified complication: Secondary | ICD-10-CM | POA: Diagnosis not present

## 2023-05-20 DIAGNOSIS — I10 Essential (primary) hypertension: Secondary | ICD-10-CM | POA: Diagnosis not present

## 2023-05-21 DIAGNOSIS — E785 Hyperlipidemia, unspecified: Secondary | ICD-10-CM | POA: Diagnosis not present

## 2023-05-21 DIAGNOSIS — I10 Essential (primary) hypertension: Secondary | ICD-10-CM | POA: Diagnosis not present

## 2023-05-21 DIAGNOSIS — Z125 Encounter for screening for malignant neoplasm of prostate: Secondary | ICD-10-CM | POA: Diagnosis not present

## 2023-05-21 DIAGNOSIS — E1169 Type 2 diabetes mellitus with other specified complication: Secondary | ICD-10-CM | POA: Diagnosis not present

## 2023-05-31 DIAGNOSIS — D492 Neoplasm of unspecified behavior of bone, soft tissue, and skin: Secondary | ICD-10-CM | POA: Diagnosis not present
